# Patient Record
Sex: Male | Born: 1955 | Race: Black or African American | Hispanic: No | Marital: Single | Smoking: Current every day smoker
Health system: Southern US, Community
[De-identification: ages and names within clinical notes are randomized; demographics above are authoritative.]

## PROBLEM LIST (undated history)

## (undated) DIAGNOSIS — Z720 Tobacco use: Secondary | ICD-10-CM

## (undated) DIAGNOSIS — J449 Chronic obstructive pulmonary disease, unspecified: Secondary | ICD-10-CM

## (undated) DIAGNOSIS — I10 Essential (primary) hypertension: Secondary | ICD-10-CM

## (undated) DIAGNOSIS — R0989 Other specified symptoms and signs involving the circulatory and respiratory systems: Secondary | ICD-10-CM

## (undated) HISTORY — DX: Essential (primary) hypertension: I10

## (undated) HISTORY — DX: Chronic obstructive pulmonary disease, unspecified: J44.9

## (undated) HISTORY — PX: UPPER GI ENDOSCOPY: SHX6162

## (undated) HISTORY — DX: Tobacco use: Z72.0

## (undated) HISTORY — PX: OTHER SURGICAL HISTORY: SHX169

## (undated) HISTORY — DX: Other specified symptoms and signs involving the circulatory and respiratory systems: R09.89

---

## 2009-02-19 ENCOUNTER — Ambulatory Visit: Payer: Self-pay | Admitting: Internal Medicine

## 2012-02-02 ENCOUNTER — Ambulatory Visit: Payer: Self-pay | Admitting: Cardiology

## 2017-01-05 ENCOUNTER — Emergency Department
Admission: EM | Admit: 2017-01-05 | Discharge: 2017-01-05 | Disposition: A | Discharge status: Home / Self Care | Payer: Self-pay | Attending: Emergency Medicine | Admitting: Emergency Medicine

## 2017-01-05 ENCOUNTER — Encounter: Payer: Self-pay | Admitting: Emergency Medicine

## 2017-01-05 DIAGNOSIS — Y929 Unspecified place or not applicable: Secondary | ICD-10-CM | POA: Insufficient documentation

## 2017-01-05 DIAGNOSIS — Y999 Unspecified external cause status: Secondary | ICD-10-CM | POA: Insufficient documentation

## 2017-01-05 DIAGNOSIS — S39012A Strain of muscle, fascia and tendon of lower back, initial encounter: Secondary | ICD-10-CM | POA: Insufficient documentation

## 2017-01-05 DIAGNOSIS — X500XXA Overexertion from strenuous movement or load, initial encounter: Secondary | ICD-10-CM | POA: Insufficient documentation

## 2017-01-05 DIAGNOSIS — Y939 Activity, unspecified: Secondary | ICD-10-CM | POA: Insufficient documentation

## 2017-01-05 MED ORDER — CYCLOBENZAPRINE HCL 5 MG PO TABS: 5.0000 mg | ORAL_TABLET | Freq: Two times a day (BID) | ORAL | 0 refills | Status: AC | PRN

## 2017-01-05 MED ORDER — MELOXICAM 7.5 MG PO TABS
7.5000 mg | ORAL_TABLET | Freq: Every day | ORAL | 0 refills | Status: AC
Start: 2017-01-05 — End: 2017-01-12

## 2017-01-05 NOTE — ED Triage Notes (Signed)
Pt c/o low back pain. Pt ambulatory to triage.

## 2017-01-05 NOTE — ED Notes (Signed)
Pt verbalized understanding of discharge instructions. NAD at this time. 

## 2017-01-06 NOTE — ED Provider Notes (Signed)
[facility] Emergency Department Provider Note  ____________________________________________  Time seen: Approximately 11:11 AM  I have reviewed the triage vital signs and the nursing notes.   HISTORY  Chief Complaint Back Pain    HPI Austin Nunez is a 61 y.o. male presenting with low back pain. Patient states that low back pain has occured for the past 2 days. He has a history of lumbar strain. He noticed low back pain when he reached over to lift a heavy bag with papers. He denies radiculopathy. He states that Flexeril has helped lumbar strain in the past. He denies prior traumas or surgeries to the low back. He denies falls of any kind. He has been afebrile. No bowel or bladder incontinence. No new weakness. He has taken ibuprofen, which has not relieved his symptoms. 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 Low back pain is described as aching and 8/10 in intensity.    No past medical history on file.  There are no active problems to display for this patient.   History reviewed. No pertinent surgical history.  Prior to Admission medications   Medication Sig Start Date End Date Taking? Authorizing Provider  cyclobenzaprine (FLEXERIL) 5 MG tablet Take 1 tablet (5 mg total) by mouth 2 (two) times daily as needed for muscle spasms. 01/05/17 01/12/17  Orvil Feil, PA-C  meloxicam (MOBIC) 7.5 MG tablet Take 1 tablet (7.5 mg total) by mouth daily. 01/05/17 01/12/17  Orvil Feil, PA-C    Allergies Patient has no known allergies.  No family history on file.  Social History Social History  Substance Use Topics  . Smoking status: Never Smoker  . Smokeless tobacco: Never Used  . Alcohol use No     Review of Systems  Constitutional: No fever/chills ENT: No upper respiratory complaints. Cardiovascular: no chest pain. Respiratory: no cough. No SOB. Musculoskeletal: Patient has low back pain.  Skin: Negative for rash, abrasions, lacerations,  ecchymosis. Neurological: Negative for headaches, focal weakness or numbness.  ____________________________________________   PHYSICAL EXAM:  VITAL SIGNS: ED Triage Vitals  Enc Vitals Group     BP 01/05/17 1037 (!) 151/87     Pulse Rate 01/05/17 1037 85     Resp 01/05/17 1037 18     Temp 01/05/17 1037 97.9 F (36.6 C)     Temp Source 01/05/17 1037 Oral     SpO2 01/05/17 1037 96 %     Weight 01/05/17 1037 220 lb (99.8 kg)     Height 01/05/17 1037 5\' 10"  (1.778 m)     Head Circumference --      Peak Flow --      Pain Score 01/05/17 1034 8     Pain Loc --      Pain Edu? --      Excl. in GC? --     Constitutional: Alert and oriented. Well appearing and in no acute distress. Neck: FROM.  Cardiovascular: Normal rate, regular rhythm. Normal S1 and S2.  Good peripheral circulation. Respiratory: Normal respiratory effort without tachypnea or retractions. Lungs CTAB. Good air entry to the bases with no decreased or absent breath sounds. Musculoskeletal: Patient has 5/5 strength in the upper and lower extremities bilaterally. Full range of motion at the shoulder, elbow and wrist bilaterally. Full range of motion at the hip, knee and ankle bilaterally. No changes in gait.  Patient's low back pain is intensified with flexion at the spine. Negative straight leg raise test bilaterally. No tenderness to palpation is elicited  with palpation along the cervical, thoracic and lumbar regions of the spine. Neurologic:  Normal speech and language. No gross focal neurologic deficits are appreciated. Cranial nerves: 2-10 normal as tested. Cerebellar: Finger-nose-finger WNL, heel to shin WNL. Speech: No dysarthria or expressive aphasia.  Skin:  Skin is warm, dry and intact. No rash noted.  ____________________________________________   LABS (all labs ordered are listed, but only abnormal results are displayed)  Labs Reviewed - No data to  display ____________________________________________  EKG   ____________________________________________  RADIOLOGY   No results found.  ____________________________________________    PROCEDURES  Procedure(s) performed:    Procedures    Medications - No data to display   ____________________________________________   INITIAL IMPRESSION / ASSESSMENT AND PLAN / ED COURSE  Pertinent labs & imaging results that were available during my care of the patient were reviewed by me and considered in my medical decision making (see chart for details).  Review of the [place] CSRS was performed in accordance of the [facility] prior to dispensing any controlled drugs.     Assessment and Plan: Low back pain: Patient presents to the emergency department with low back pain. Patient denies fever, recreational drug use, bowel or bladder incontinence or weakness. Patient noticed low back pain after lifting a bag. Patient has no radiculopathy with a reassuring physical exam. Vital signs are also reassuring aside from hypertension. Lumbar strain is likely. Patient was prescribed Flexeril and Mobic to be used as needed for pain and inflammation. A referral was made to orthopedics, Dr. Hyacinth MeekerMiller. Patient was advised to make an appointment with orthopedics in one week if low back pain persists. All patient questions were answered. ____________________________________________  FINAL CLINICAL IMPRESSION(S) / ED DIAGNOSES  Final diagnoses:  Strain of lumbar region, initial encounter      NEW MEDICATIONS STARTED DURING THIS VISIT:  Discharge Medication List as of 01/05/2017 12:25 PM    START taking these medications   Details  cyclobenzaprine (FLEXERIL) 5 MG tablet Take 1 tablet (5 mg total) by mouth 2 (two) times daily as needed for muscle spasms., Starting Wed 01/05/2017, Until Wed 01/12/2017, Print    meloxicam (MOBIC) 7.5 MG tablet Take 1 tablet (7.5 mg total) by mouth daily., Starting Wed  01/05/2017, Until Wed 01/12/2017, Print            This chart was dictated using voice recognition software/Dragon. Despite best efforts to proofread, errors can occur which can change the meaning. Any change was purely unintentional.    Orvil FeilJaclyn M Rafel Garde, PA-C 01/06/17 1129    Emily FilbertJonathan E Williams, MD 01/06/17 [phone]

## 2018-06-30 DIAGNOSIS — I10 Essential (primary) hypertension: Secondary | ICD-10-CM | POA: Insufficient documentation

## 2018-06-30 DIAGNOSIS — Z720 Tobacco use: Secondary | ICD-10-CM | POA: Insufficient documentation

## 2018-06-30 DIAGNOSIS — R0989 Other specified symptoms and signs involving the circulatory and respiratory systems: Secondary | ICD-10-CM | POA: Insufficient documentation

## 2019-03-05 HISTORY — PX: OTHER SURGICAL HISTORY: SHX169

## 2020-01-07 DIAGNOSIS — J302 Other seasonal allergic rhinitis: Secondary | ICD-10-CM | POA: Insufficient documentation

## 2020-07-09 ENCOUNTER — Telehealth: Payer: Self-pay | Admitting: *Deleted

## 2020-07-09 DIAGNOSIS — Z122 Encounter for screening for malignant neoplasm of respiratory organs: Secondary | ICD-10-CM

## 2020-07-09 DIAGNOSIS — Z87891 Personal history of nicotine dependence: Secondary | ICD-10-CM

## 2020-07-09 NOTE — Telephone Encounter (Signed)
Received referral for initial lung cancer screening scan. Contacted patient and obtained smoking history,(current, 92 pack year) as well as answering questions related to screening process. Patient denies signs of lung cancer such as weight loss or hemoptysis. Patient denies comorbidity that would prevent curative treatment if lung cancer were found. Patient is scheduled for shared decision making visit and CT scan on date tbd due to patient preference.

## 2020-08-01 ENCOUNTER — Inpatient Hospital Stay: Payer: PRIVATE HEALTH INSURANCE | Attending: Oncology | Admitting: Hospice and Palliative Medicine

## 2020-08-01 DIAGNOSIS — Z87891 Personal history of nicotine dependence: Secondary | ICD-10-CM | POA: Diagnosis not present

## 2020-08-01 DIAGNOSIS — Z122 Encounter for screening for malignant neoplasm of respiratory organs: Secondary | ICD-10-CM | POA: Diagnosis not present

## 2020-08-01 NOTE — Progress Notes (Signed)
Virtual Visit via Video Note  I connected with@ on 08/01/20 at@ by a video enabled telemedicine application and verified that I am speaking with the correct person using two identifiers.   I discussed the limitations of evaluation and management by telemedicine and the availability of in person appointments. The patient expressed understanding and agreed to proceed.  In accordance with CMS guidelines, patient has met eligibility criteria including age, absence of signs or symptoms of lung cancer.  Social History   Tobacco Use  . Smoking status: Never Smoker  . Smokeless tobacco: Never Used  Substance Use Topics  . Alcohol use: No  . Drug use: Not on file      A shared decision-making session was conducted prior to the performance of CT scan. This includes one or more decision aids, includes benefits and harms of screening, follow-up diagnostic testing, over-diagnosis, false positive rate, and total radiation exposure.   Counseling on the importance of adherence to annual lung cancer LDCT screening, impact of co-morbidities, and ability or willingness to undergo diagnosis and treatment is imperative for compliance of the program.   Counseling on the importance of continued smoking cessation for former smokers; the importance of smoking cessation for current smokers, and information about tobacco cessation interventions have been given to patient including [place] and 1800 quit [place] programs.   Written order for lung cancer screening with LDCT has been given to the patient and any and all questions have been answered to the best of my abilities.    Yearly follow up will be coordinated by Burgess Estelle, Thoracic Navigator.  Time Total: 15 minutes  Visit consisted of counseling and education dealing with complex health screening. Greater than 50%  of this time was spent counseling and coordinating care related to the above assessment and plan.  Signed by: Altha Harm,  PhD, NP-C

## 2020-08-04 ENCOUNTER — Encounter: Payer: Self-pay | Admitting: *Deleted

## 2020-08-04 ENCOUNTER — Ambulatory Visit: Admission: RE | Admit: 2020-08-04 | Payer: PRIVATE HEALTH INSURANCE | Source: Ambulatory Visit

## 2020-08-05 ENCOUNTER — Ambulatory Visit
Admission: RE | Admit: 2020-08-05 | Discharge: 2020-08-05 | Disposition: A | Discharge status: Home / Self Care | Payer: Self-pay | Source: Ambulatory Visit | Attending: Oncology | Admitting: Oncology

## 2020-08-05 ENCOUNTER — Other Ambulatory Visit: Payer: Self-pay

## 2020-08-05 DIAGNOSIS — Z122 Encounter for screening for malignant neoplasm of respiratory organs: Secondary | ICD-10-CM | POA: Insufficient documentation

## 2020-08-05 DIAGNOSIS — Z87891 Personal history of nicotine dependence: Secondary | ICD-10-CM | POA: Insufficient documentation

## 2020-08-11 ENCOUNTER — Encounter: Payer: Self-pay | Admitting: *Deleted

## 2020-12-21 IMAGING — CT CT CHEST LUNG CANCER SCREENING LOW DOSE W/O CM
2 of 5 series · 15 of 40 positions shown, 18 images · non-contrast
Comparison: None.

CLINICAL DATA: 64-year-old male current smoker, with 92 pack-year
history of smoking, for initial lung cancer screening

EXAM:
CT CHEST WITHOUT CONTRAST LOW-DOSE FOR LUNG CANCER SCREENING
TECHNIQUE: Multidetector CT imaging of the chest was performed following the
standard protocol without IV contrast.

[Series 3: lung 1.00 · axial · 0.81mm/px · z∈[-1240,-910]mm · 12 of 366 slices shown, 15 images]
[im 18/366  mediastinal]
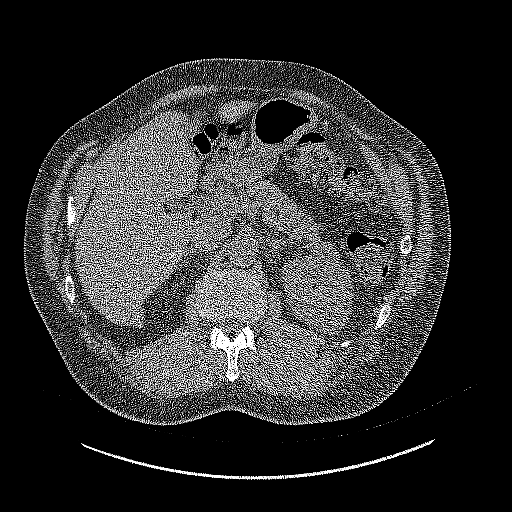
[im 18/366  lung]
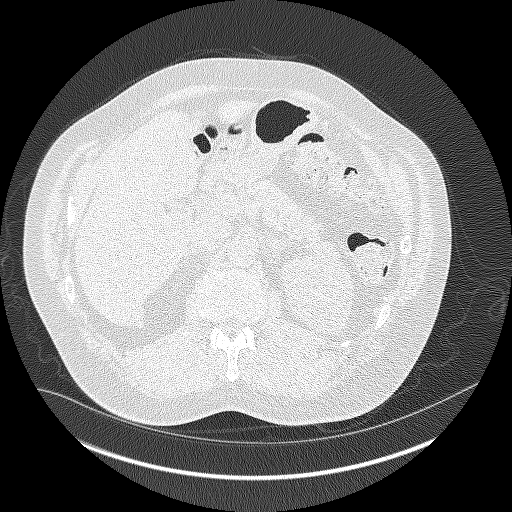
[im 53/366  lung]
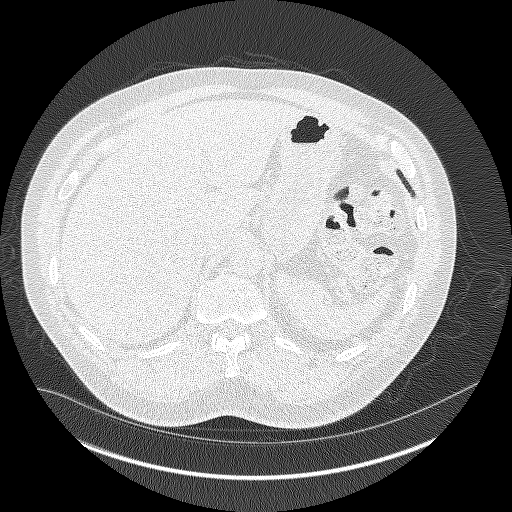
[im 87/366  lung]
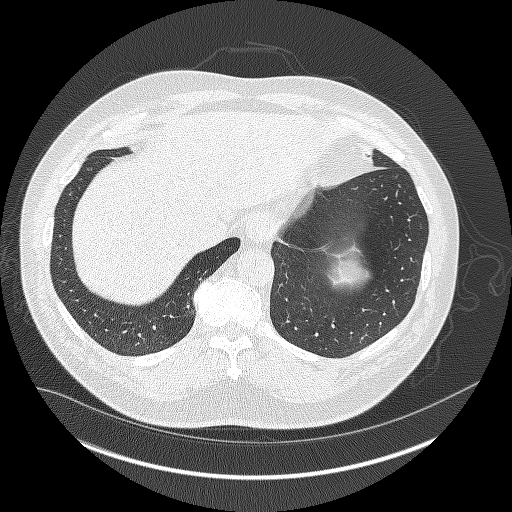
[im 105/366  lung]
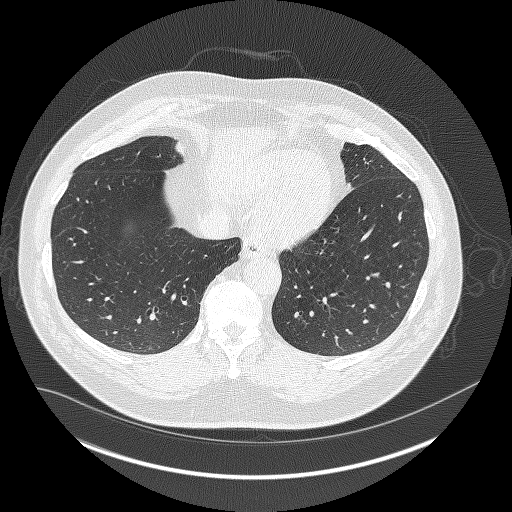
[im 140/366  mediastinal]
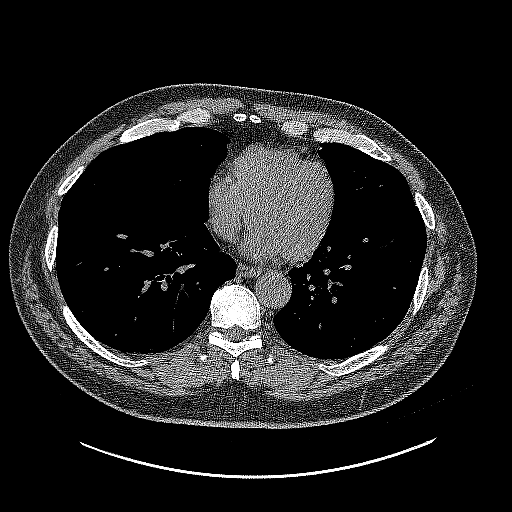
[im 140/366  lung]
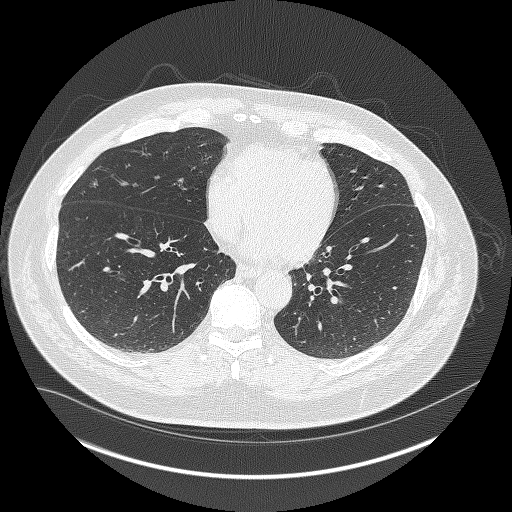
[im 174/366  lung]
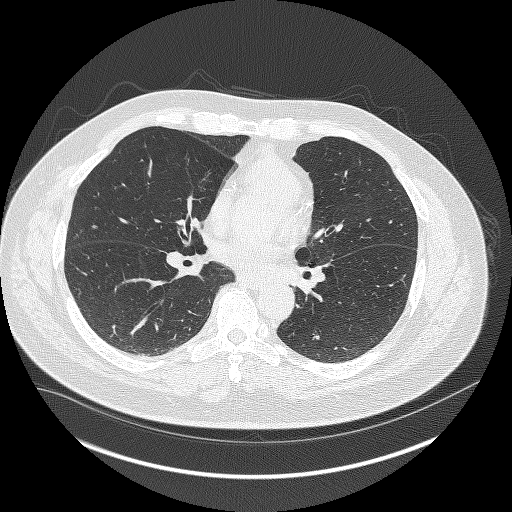
[im 192/366  lung]
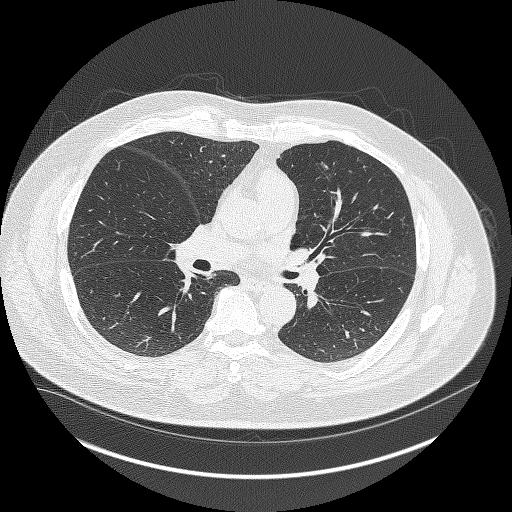
[im 226/366  lung]
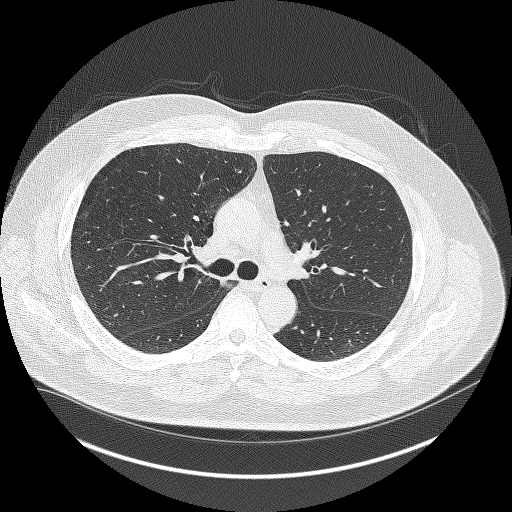
[im 261/366  mediastinal]
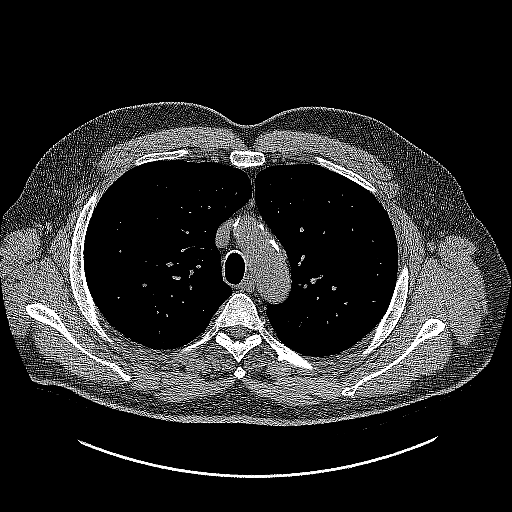
[im 261/366  lung]
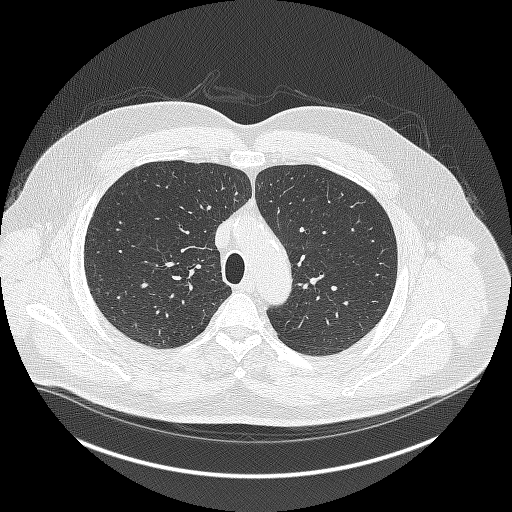
[im 279/366  lung]
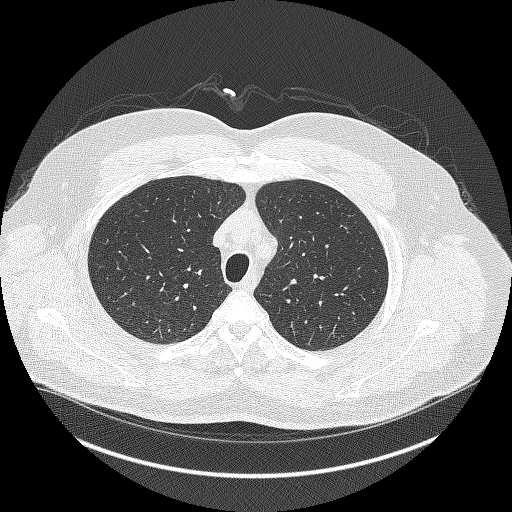
[im 313/366  lung]
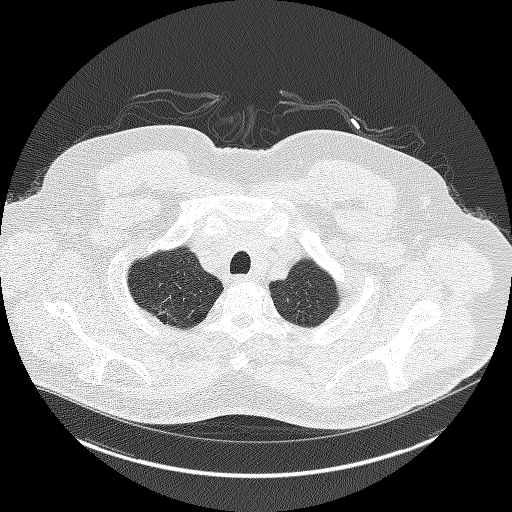
[im 348/366  lung]
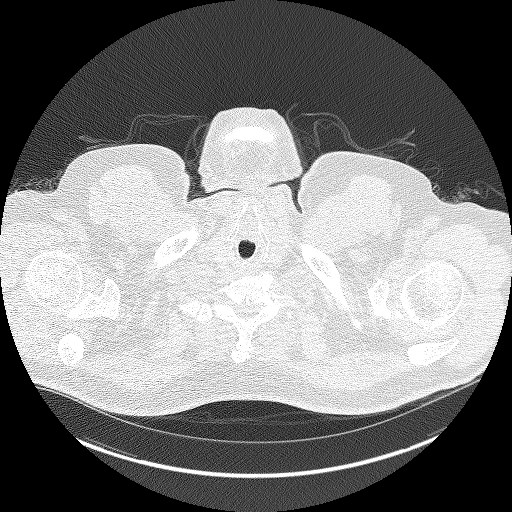

[Series 4: coronals lung 1.00 cor · coronal · 0.72mm/px · 3 of 359 slices shown]
[im 72/359  lung]
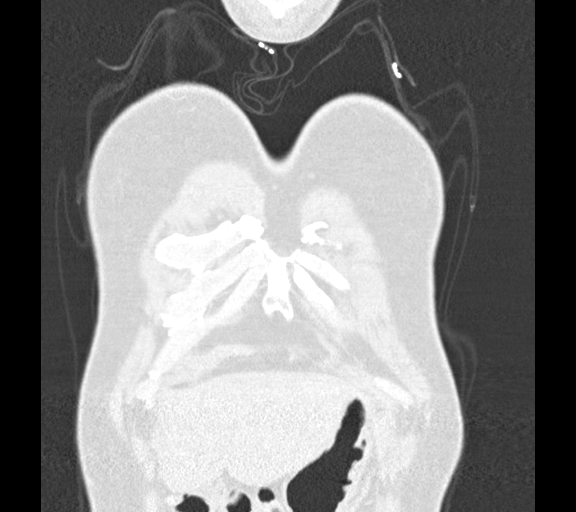
[im 144/359  lung]
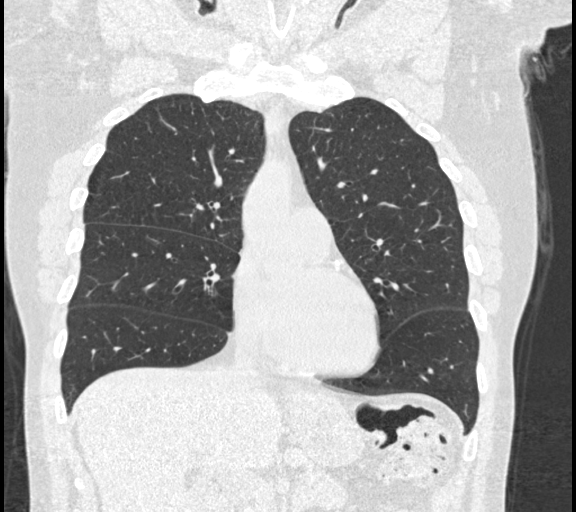
[im 215/359  lung]
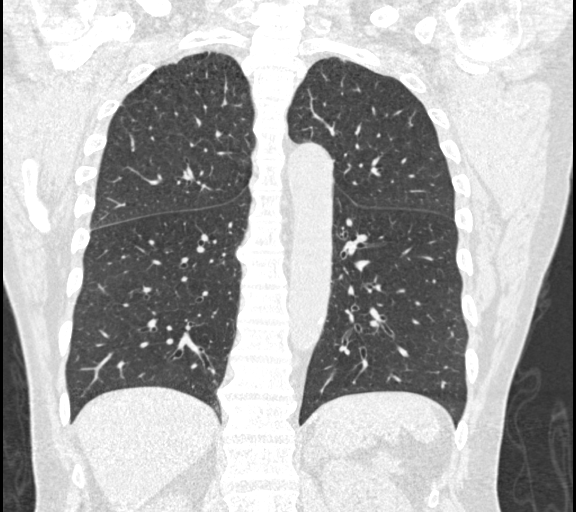

[15 of 40 positions shown; findings below may reference images not displayed]

FINDINGS: Cardiovascular: Heart is normal in size.  No pericardial effusion.

No evidence thoracic aortic aneurysm. Atherosclerotic calcifications
of the aortic arch.

Mild three-vessel coronary atherosclerosis.

Mediastinum/Nodes: No suspicious mediastinal lymphadenopathy.

Visualized thyroid unremarkable.

Lungs/Pleura: Mild biapical pleural-parenchymal scarring.

Mild centrilobular and paraseptal emphysematous changes, upper lung
predominant.

5.7 mm subpleural nodule in the posterior right lower lobe.

No focal consolidation.

No pleural effusion or pneumothorax.

Upper Abdomen: Visualized upper abdomen is notable for a 2.6 cm left
adrenal adenoma and mild vascular calcifications.

Musculoskeletal: Mild degenerative changes of the visualized
thoracolumbar spine.
IMPRESSION: Lung-RADS 2, benign appearance or behavior. Continue annual
screening with low-dose chest CT without contrast in 12 months.

Aortic Atherosclerosis (UB8F3-7Z6.6) and Emphysema (UB8F3-B3P.1).

## 2021-02-04 ENCOUNTER — Telehealth: Payer: Self-pay | Admitting: Oncology

## 2021-02-04 NOTE — Telephone Encounter (Signed)
Patient called and stated that he needs to cancel his appt on 2/21. He declined to r/s at this time due to "work conflicts" and stated he would call back.

## 2021-02-09 ENCOUNTER — Inpatient Hospital Stay: Payer: PRIVATE HEALTH INSURANCE

## 2021-02-09 ENCOUNTER — Inpatient Hospital Stay: Payer: PRIVATE HEALTH INSURANCE | Admitting: Oncology

## 2021-08-17 DIAGNOSIS — I700 Atherosclerosis of aorta: Secondary | ICD-10-CM | POA: Insufficient documentation

## 2021-09-01 ENCOUNTER — Encounter: Payer: Self-pay | Admitting: Oncology

## 2021-09-01 ENCOUNTER — Inpatient Hospital Stay: Payer: Medicare Other | Attending: Oncology | Admitting: Oncology

## 2021-09-01 ENCOUNTER — Inpatient Hospital Stay: Payer: Medicare Other

## 2021-09-01 VITALS — BP 147/86 | HR 84 | Temp 98.2°F | Resp 18 | Wt 217.7 lb

## 2021-09-01 DIAGNOSIS — Z79899 Other long term (current) drug therapy: Secondary | ICD-10-CM | POA: Diagnosis not present

## 2021-09-01 DIAGNOSIS — I10 Essential (primary) hypertension: Secondary | ICD-10-CM | POA: Insufficient documentation

## 2021-09-01 DIAGNOSIS — F17210 Nicotine dependence, cigarettes, uncomplicated: Secondary | ICD-10-CM | POA: Insufficient documentation

## 2021-09-01 DIAGNOSIS — D72829 Elevated white blood cell count, unspecified: Secondary | ICD-10-CM

## 2021-09-01 DIAGNOSIS — Z720 Tobacco use: Secondary | ICD-10-CM

## 2021-09-01 DIAGNOSIS — J449 Chronic obstructive pulmonary disease, unspecified: Secondary | ICD-10-CM | POA: Insufficient documentation

## 2021-09-01 LAB — CBC WITH DIFFERENTIAL/PLATELET
Abs Immature Granulocytes: 0.06 10*3/uL (ref 0.00–0.07)
Basophils Absolute: 0.1 10*3/uL (ref 0.0–0.1)
Basophils Relative: 1 %
Eosinophils Absolute: 0.3 10*3/uL (ref 0.0–0.5)
Eosinophils Relative: 2 %
HCT: 48.8 % (ref 39.0–52.0)
Hemoglobin: 16.2 g/dL (ref 13.0–17.0)
Immature Granulocytes: 1 %
Lymphocytes Relative: 18 %
Lymphs Abs: 2.3 10*3/uL (ref 0.7–4.0)
MCH: 29 pg (ref 26.0–34.0)
MCHC: 33.2 g/dL (ref 30.0–36.0)
MCV: 87.3 fL (ref 80.0–100.0)
Monocytes Absolute: 0.9 10*3/uL (ref 0.1–1.0)
Monocytes Relative: 7 %
Neutro Abs: 9.1 10*3/uL — ABNORMAL HIGH (ref 1.7–7.7)
Neutrophils Relative %: 71 %
Platelets: 267 10*3/uL (ref 150–400)
RBC: 5.59 MIL/uL (ref 4.22–5.81)
RDW: 15.5 % (ref 11.5–15.5)
WBC: 12.7 10*3/uL — ABNORMAL HIGH (ref 4.0–10.5)
nRBC: 0 % (ref 0.0–0.2)

## 2021-09-01 LAB — TECHNOLOGIST SMEAR REVIEW
Plt Morphology: NORMAL
RBC MORPHOLOGY: NORMAL
WBC MORPHOLOGY: NORMAL

## 2021-09-01 LAB — COMPREHENSIVE METABOLIC PANEL
ALT: 18 U/L (ref 0–44)
AST: 21 U/L (ref 15–41)
Albumin: 3.9 g/dL (ref 3.5–5.0)
Alkaline Phosphatase: 80 U/L (ref 38–126)
Anion gap: 7 (ref 5–15)
BUN: 10 mg/dL (ref 8–23)
CO2: 24 mmol/L (ref 22–32)
Calcium: 8.7 mg/dL — ABNORMAL LOW (ref 8.9–10.3)
Chloride: 105 mmol/L (ref 98–111)
Creatinine, Ser: 0.9 mg/dL (ref 0.61–1.24)
GFR, Estimated: >60 mL/min (ref >60)
Glucose, Bld: 92 mg/dL (ref 70–99)
Potassium: 3.9 mmol/L (ref 3.5–5.1)
Sodium: 136 mmol/L (ref 135–145)
Total Bilirubin: 0.7 mg/dL (ref 0.3–1.2)
Total Protein: 7.8 g/dL (ref 6.5–8.1)

## 2021-09-01 LAB — LACTATE DEHYDROGENASE: LDH: 154 U/L (ref 98–192)

## 2021-09-01 NOTE — Progress Notes (Signed)
Hematology/Oncology Consult note [facility] Telephone:([phone] [phone]   Patient Care Team: Tracie Harrier, MD as PCP - General (Internal Medicine)  REFERRING PROVIDER: Tracie Harrier, MD  CHIEF COMPLAINTS/REASON FOR VISIT:  Evaluation of leukocytosis  HISTORY OF PRESENTING ILLNESS:  Austin Nunez is a  65 y.o.  male with PMH listed below who was referred to me for evaluation of leukocytosis Reviewed patient' recent labs obtained by PCP.   08/12/2021 CBC showed elevated white count of 14.3, predominantly neutrophilia and basophilia.  Previous lab records reviewed. Leukocytosis onset of chronic, duration is since at least March 2019.   No aggravating or elevated factors. Associated symptoms or signs: denies Denies weight loss, fever, chills,night sweats, fatigue Smoking history: every day smoker, 2 packs per day, 92 pack year smoking history History of recent oral steroid use or steroid injection: denies History of recent infection: denies Autoimmune disease history: denies    Review of Systems  Constitutional:  Negative for appetite change, chills, fatigue, fever and unexpected weight change.  HENT:   Negative for hearing loss and voice change.   Eyes:  Negative for eye problems and icterus.  Respiratory:  Negative for chest tightness, cough and shortness of breath.   Cardiovascular:  Negative for chest pain and leg swelling.  Gastrointestinal:  Negative for abdominal distention and abdominal pain.  Endocrine: Negative for hot flashes.  Genitourinary:  Negative for difficulty urinating, dysuria and frequency.   Musculoskeletal:  Negative for arthralgias.  Skin:  Negative for itching and rash.  Neurological:  Negative for light-headedness and numbness.  Hematological:  Negative for adenopathy. Does not bruise/bleed easily.  Psychiatric/Behavioral:  Negative for confusion.     MEDICAL HISTORY:  Past Medical History:  Diagnosis  Date   COPD (chronic obstructive pulmonary disease) ([place])    Essential hypertension    Pulmonary hyperinflation    Tobacco user     SURGICAL HISTORY: Past Surgical History:  Procedure Laterality Date   colonoscopy  03/05/2019   hyperplastic colon polyp/ repeat 10 yrs   repair malunion/ non union femur right 1989     UPPER GI ENDOSCOPY      SOCIAL HISTORY: Social History   Socioeconomic History   Marital status: Single    Spouse name: Not on file   Number of children: Not on file   Years of education: Not on file   Highest education level: Not on file  Occupational History   Not on file  Tobacco Use   Smoking status: Every Day    Packs/day: 2.00    Years: 46.00    Pack years: 92.00    Types: Cigarettes   Smokeless tobacco: Never  Substance and Sexual Activity   Alcohol use: No   Drug use: Not on file   Sexual activity: Not on file  Other Topics Concern   Not on file  Social History Narrative   Not on file   Social Determinants of Health   Financial Resource Strain: Not on file  Food Insecurity: Not on file  Transportation Needs: Not on file  Physical Activity: Not on file  Stress: Not on file  Social Connections: Not on file  Intimate Partner Violence: Not on file    FAMILY HISTORY: History reviewed. No pertinent family history.  ALLERGIES:  is allergic to aspirin.  MEDICATIONS:  Current Outpatient Medications  Medication Sig Dispense Refill   amLODipine (NORVASC) 10 MG tablet Take 10 mg by mouth daily.     budesonide-formoterol (SYMBICORT) 160-4.5 MCG/ACT  inhaler Inhale into the lungs.     olmesartan (BENICAR) 40 MG tablet Take 40 mg by mouth daily.     No current facility-administered medications for this visit.     PHYSICAL EXAMINATION: ECOG PERFORMANCE STATUS: 0 - Asymptomatic Vitals:   09/01/21 0943  BP: (!) 147/86  Pulse: 84  Resp: 18  Temp: 98.2 F (36.8 C)  SpO2: 96%   Filed Weights   09/01/21 0943  Weight: 217 lb 11.2 oz (98.7  kg)    Physical Exam Constitutional:      General: He is not in acute distress. HENT:     Head: Normocephalic and atraumatic.  Eyes:     General: No scleral icterus. Cardiovascular:     Rate and Rhythm: Normal rate and regular rhythm.     Heart sounds: Normal heart sounds.  Pulmonary:     Effort: Pulmonary effort is normal. No respiratory distress.     Breath sounds: No wheezing.     Comments: Severely decreased breath sound bilaterally.  Abdominal:     General: Bowel sounds are normal. There is no distension.     Palpations: Abdomen is soft.  Musculoskeletal:        General: No deformity. Normal range of motion.     Cervical back: Normal range of motion and neck supple.  Skin:    General: Skin is warm and dry.     Findings: No erythema or rash.  Neurological:     Mental Status: He is alert and oriented to person, place, and time. Mental status is at baseline.     Cranial Nerves: No cranial nerve deficit.     Coordination: Coordination normal.  Psychiatric:        Mood and Affect: Mood normal.    CMP Latest Ref Rng & Units 09/01/2021  Glucose 70 - 99 mg/dL 92  BUN 8 - 23 mg/dL 10  Creatinine 0.61 - 1.24 mg/dL 0.90  Sodium 135 - 145 mmol/L 136  Potassium 3.5 - 5.1 mmol/L 3.9  Chloride 98 - 111 mmol/L 105  CO2 22 - 32 mmol/L 24  Calcium 8.9 - 10.3 mg/dL 8.7(L)  Total Protein 6.5 - 8.1 g/dL 7.8  Total Bilirubin 0.3 - 1.2 mg/dL 0.7  Alkaline Phos 38 - 126 U/L 80  AST 15 - 41 U/L 21  ALT 0 - 44 U/L 18   CBC Latest Ref Rng & Units 09/01/2021  WBC 4.0 - 10.5 K/uL 12.7(H)  Hemoglobin 13.0 - 17.0 g/dL 16.2  Hematocrit 39.0 - 52.0 % 48.8  Platelets 150 - 400 K/uL 267     RADIOGRAPHIC STUDIES: I have personally reviewed the radiological images as listed and agreed with the findings in the report. No results found.  LABORATORY DATA:  I have reviewed the data as listed Lab Results  Component Value Date   WBC 12.7 (H) 09/01/2021   HGB 16.2 09/01/2021   HCT 48.8  09/01/2021   MCV 87.3 09/01/2021   PLT 267 09/01/2021   Recent Labs    09/01/21 1019  NA 136  K 3.9  CL 105  CO2 24  GLUCOSE 92  BUN 10  CREATININE 0.90  CALCIUM 8.7*  GFRNONAA >60  PROT 7.8  ALBUMIN 3.9  AST 21  ALT 18  ALKPHOS 80  BILITOT 0.7   Iron/TIBC/Ferritin/ %Sat No results found for: IRON, TIBC, FERRITIN, IRONPCTSAT      ASSESSMENT & PLAN:  1. Leukocytosis, unspecified type   2. Tobacco user    Labs  reviewed and discussed with patient that Leukocytosis, predominantly neutrophilia/predominantly,  can be secondary to infection, chronic inflammation, smoking, autoimmune disease, or underlying bone marrow disorders, smoking.   For the work up of patient's leukocytosis, I recommend checking CBC;CMP, LDH, pathology smear review, flowcytometry. SPEP, Jak2 V617F mutation with reflex, etc.  Tobacco abuse, smoke cessation was discussed with patient.  Recommend annual lung cancer screening. Refer to lung cancer screening program.     Orders Placed This Encounter  Procedures   Technologist smear review    Standing Status:   Future    Number of Occurrences:   1    Standing Expiration Date:   09/01/2022   CBC with Differential/Platelet    Standing Status:   Future    Number of Occurrences:   1    Standing Expiration Date:   09/01/2022   Comprehensive metabolic panel    Standing Status:   Future    Number of Occurrences:   1    Standing Expiration Date:   09/01/2022   Multiple Myeloma Panel (SPEP&IFE w/QIG)    Standing Status:   Future    Number of Occurrences:   1    Standing Expiration Date:   09/01/2022   Kappa/lambda light chains    Standing Status:   Future    Number of Occurrences:   1    Standing Expiration Date:   09/01/2022   Flow cytometry panel-leukemia/lymphoma work-up    Standing Status:   Future    Number of Occurrences:   1    Standing Expiration Date:   09/01/2022   Lactate dehydrogenase    Standing Status:   Future    Number of Occurrences:    1    Standing Expiration Date:   09/01/2022   JAK2 V617F, w Reflex to CALR/E12/MPL    Standing Status:   Future    Number of Occurrences:   1    Standing Expiration Date:   09/01/2022   BCR-ABL1 FISH    Standing Status:   Future    Number of Occurrences:   1    Standing Expiration Date:   09/01/2022   Ambulatory Referral for Lung Cancer Scre    Referral Priority:   Routine    Referral Type:   Consultation    Referral Reason:   Specialty Services Required    Number of Visits Requested:   1    All questions were answered. The patient knows to call the clinic with any problems questions or concerns.  Return of visit: 2 weeks to discuss labs. Thank you for this kind referral and the opportunity to participate in the care of this patient. A copy of today's note is routed to referring provider   Earlie Server, MD, PhD Hematology Oncology [facility] Pager- [phone] 09/01/2021

## 2021-09-02 LAB — COMP PANEL: LEUKEMIA/LYMPHOMA

## 2021-09-02 LAB — KAPPA/LAMBDA LIGHT CHAINS
Kappa free light chain: 32 mg/L — ABNORMAL HIGH (ref 3.3–19.4)
Kappa, lambda light chain ratio: 1.88 — ABNORMAL HIGH (ref 0.26–1.65)
Lambda free light chains: 17 mg/L (ref 5.7–26.3)

## 2021-09-04 LAB — BCR-ABL1 FISH
Cells Analyzed: 200
Cells Counted: 200

## 2021-09-05 LAB — MULTIPLE MYELOMA PANEL, SERUM
Albumin SerPl Elph-Mcnc: 3.5 g/dL (ref 2.9–4.4)
Albumin/Glob SerPl: 1.1 (ref 0.7–1.7)
Alpha 1: 0.2 g/dL (ref 0.0–0.4)
Alpha2 Glob SerPl Elph-Mcnc: 0.7 g/dL (ref 0.4–1.0)
B-Globulin SerPl Elph-Mcnc: 0.9 g/dL (ref 0.7–1.3)
Gamma Glob SerPl Elph-Mcnc: 1.3 g/dL (ref 0.4–1.8)
Globulin, Total: 3.2 g/dL (ref 2.2–3.9)
IgA: 209 mg/dL (ref 61–437)
IgG (Immunoglobin G), Serum: 1588 mg/dL (ref 603–1613)
IgM (Immunoglobulin M), Srm: 80 mg/dL (ref 20–172)
Total Protein ELP: 6.7 g/dL (ref 6.0–8.5)

## 2021-09-09 LAB — JAK2 V617F, W REFLEX TO CALR/E12/MPL

## 2021-09-09 LAB — CALR + JAK2 E12-15 + MPL (REFLEXED)

## 2021-09-18 ENCOUNTER — Inpatient Hospital Stay: Payer: Medicare Other | Admitting: Oncology

## 2021-10-05 ENCOUNTER — Inpatient Hospital Stay: Payer: Medicare Other

## 2021-10-05 ENCOUNTER — Other Ambulatory Visit: Payer: Self-pay

## 2021-10-05 ENCOUNTER — Inpatient Hospital Stay: Payer: Medicare Other | Attending: Oncology | Admitting: Oncology

## 2021-10-05 ENCOUNTER — Encounter: Payer: Self-pay | Admitting: Oncology

## 2021-10-05 VITALS — BP 129/82 | HR 91 | Temp 97.0°F | Resp 18 | Wt 217.8 lb

## 2021-10-05 DIAGNOSIS — F17210 Nicotine dependence, cigarettes, uncomplicated: Secondary | ICD-10-CM | POA: Diagnosis not present

## 2021-10-05 DIAGNOSIS — I10 Essential (primary) hypertension: Secondary | ICD-10-CM | POA: Insufficient documentation

## 2021-10-05 DIAGNOSIS — Z720 Tobacco use: Secondary | ICD-10-CM | POA: Diagnosis not present

## 2021-10-05 DIAGNOSIS — J449 Chronic obstructive pulmonary disease, unspecified: Secondary | ICD-10-CM | POA: Diagnosis not present

## 2021-10-05 DIAGNOSIS — D72829 Elevated white blood cell count, unspecified: Secondary | ICD-10-CM

## 2021-10-05 NOTE — Progress Notes (Signed)
Hematology/Oncology Consult note [facility] Telephone:([phone] [phone]   Patient Care Team: Tracie Harrier, MD as PCP - General (Internal Medicine)  REFERRING PROVIDER: Tracie Harrier, MD  CHIEF COMPLAINTS/REASON FOR VISIT:  Evaluation of leukocytosis  HISTORY OF PRESENTING ILLNESS:  Austin Nunez is a  65 y.o.  male with PMH listed below who was referred to me for evaluation of leukocytosis Reviewed patient' recent labs obtained by PCP.   08/12/2021 CBC showed elevated white count of 14.3, predominantly neutrophilia and basophilia.  Previous lab records reviewed. Leukocytosis onset of chronic, duration is since at least March 2019.   No aggravating or elevated factors. Associated symptoms or signs: denies Denies weight loss, fever, chills,night sweats, fatigue Smoking history: every day smoker, 2 packs per day, 92 pack year smoking history History of recent oral steroid use or steroid injection: denies History of recent infection: denies Autoimmune disease history: denies  INTERVAL HISTORY Emeril Stille is a 65 y.o. male who has above history reviewed by me today presents for follow up visit for leukocytosis Problems and complaints are listed below: Patient had a blood work done and presents to discuss results.  No new complaints.   Review of Systems  Constitutional:  Negative for appetite change, chills, fatigue, fever and unexpected weight change.  HENT:   Negative for hearing loss, trouble swallowing and voice change.   Eyes:  Negative for eye problems and icterus.  Respiratory:  Negative for chest tightness, cough and shortness of breath.   Cardiovascular:  Negative for chest pain and leg swelling.  Gastrointestinal:  Negative for abdominal distention and abdominal pain.  Endocrine: Negative for hot flashes.  Genitourinary:  Negative for difficulty urinating, dysuria and frequency.   Musculoskeletal:  Negative for arthralgias.   Skin:  Negative for itching and rash.  Neurological:  Negative for light-headedness and numbness.  Hematological:  Negative for adenopathy. Does not bruise/bleed easily.  Psychiatric/Behavioral:  Negative for confusion.     MEDICAL HISTORY:  Past Medical History:  Diagnosis Date   COPD (chronic obstructive pulmonary disease) ([place])    Essential hypertension    Pulmonary hyperinflation    Tobacco user     SURGICAL HISTORY: Past Surgical History:  Procedure Laterality Date   colonoscopy  03/05/2019   hyperplastic colon polyp/ repeat 10 yrs   repair malunion/ non union femur right 1989     UPPER GI ENDOSCOPY      SOCIAL HISTORY: Social History   Socioeconomic History   Marital status: Single    Spouse name: Not on file   Number of children: Not on file   Years of education: Not on file   Highest education level: Not on file  Occupational History   Not on file  Tobacco Use   Smoking status: Every Day    Packs/day: 2.00    Years: 46.00    Pack years: 92.00    Types: Cigarettes   Smokeless tobacco: Never  Substance and Sexual Activity   Alcohol use: No   Drug use: Not on file   Sexual activity: Not on file  Other Topics Concern   Not on file  Social History Narrative   Not on file   Social Determinants of Health   Financial Resource Strain: Not on file  Food Insecurity: Not on file  Transportation Needs: Not on file  Physical Activity: Not on file  Stress: Not on file  Social Connections: Not on file  Intimate Partner Violence: Not on file  FAMILY HISTORY: History reviewed. No pertinent family history.  ALLERGIES:  is allergic to aspirin.  MEDICATIONS:  Current Outpatient Medications  Medication Sig Dispense Refill   amLODipine (NORVASC) 10 MG tablet Take 10 mg by mouth daily.     budesonide-formoterol (SYMBICORT) 160-4.5 MCG/ACT inhaler Inhale into the lungs.     olmesartan (BENICAR) 40 MG tablet Take 40 mg by mouth daily.     No current  facility-administered medications for this visit.     PHYSICAL EXAMINATION: ECOG PERFORMANCE STATUS: 0 - Asymptomatic Vitals:   10/05/21 0842  BP: 129/82  Pulse: 91  Resp: 18  Temp: (!) 97 F (36.1 C)  SpO2: 96%   Filed Weights   10/05/21 0842  Weight: 217 lb 12.8 oz (98.8 kg)    Physical Exam Constitutional:      General: He is not in acute distress. HENT:     Head: Normocephalic and atraumatic.  Eyes:     General: No scleral icterus. Cardiovascular:     Rate and Rhythm: Normal rate and regular rhythm.     Heart sounds: Normal heart sounds.  Pulmonary:     Effort: Pulmonary effort is normal. No respiratory distress.     Breath sounds: No wheezing.     Comments: Severely decreased breath sound bilaterally.  Abdominal:     General: Bowel sounds are normal. There is no distension.     Palpations: Abdomen is soft.  Musculoskeletal:        General: No deformity. Normal range of motion.     Cervical back: Normal range of motion and neck supple.  Skin:    General: Skin is warm and dry.     Findings: No erythema or rash.  Neurological:     Mental Status: He is alert and oriented to person, place, and time. Mental status is at baseline.     Cranial Nerves: No cranial nerve deficit.     Coordination: Coordination normal.  Psychiatric:        Mood and Affect: Mood normal.    CMP Latest Ref Rng & Units 09/01/2021  Glucose 70 - 99 mg/dL 92  BUN 8 - 23 mg/dL 10  Creatinine 0.61 - 1.24 mg/dL 0.90  Sodium 135 - 145 mmol/L 136  Potassium 3.5 - 5.1 mmol/L 3.9  Chloride 98 - 111 mmol/L 105  CO2 22 - 32 mmol/L 24  Calcium 8.9 - 10.3 mg/dL 8.7(L)  Total Protein 6.5 - 8.1 g/dL 7.8  Total Bilirubin 0.3 - 1.2 mg/dL 0.7  Alkaline Phos 38 - 126 U/L 80  AST 15 - 41 U/L 21  ALT 0 - 44 U/L 18   CBC Latest Ref Rng & Units 09/01/2021  WBC 4.0 - 10.5 K/uL 12.7(H)  Hemoglobin 13.0 - 17.0 g/dL 16.2  Hematocrit 39.0 - 52.0 % 48.8  Platelets 150 - 400 K/uL 267     RADIOGRAPHIC  STUDIES: I have personally reviewed the radiological images as listed and agreed with the findings in the report. No results found.  LABORATORY DATA:  I have reviewed the data as listed Lab Results  Component Value Date   WBC 12.7 (H) 09/01/2021   HGB 16.2 09/01/2021   HCT 48.8 09/01/2021   MCV 87.3 09/01/2021   PLT 267 09/01/2021   Recent Labs    09/01/21 1019  NA 136  K 3.9  CL 105  CO2 24  GLUCOSE 92  BUN 10  CREATININE 0.90  CALCIUM 8.7*  GFRNONAA >60  PROT 7.8  ALBUMIN  3.9  AST 21  ALT 18  ALKPHOS 80  BILITOT 0.7    Iron/TIBC/Ferritin/ %Sat No results found for: IRON, TIBC, FERRITIN, IRONPCTSAT      ASSESSMENT & PLAN:  1. Leukocytosis, unspecified type   2. Tobacco user   3. Hypocalcemia    #Leukocytosis  Labs reviewed and discussed with patient. Multiple myeloma panel is negative.  LDH normal.JAK2 V617F mutation negative, with reflex to other mutations CALR, MPL, JAK 2 Ex 12-15 mutations negative. Flow cytometry negative. -Check T-cell receptor rearrangement. BCR ABL negative. I think leukocytosis is reactive in nature, likely due to tobacco use.   Tobacco use, smoke cessation was discussed. Refer patient to lung cancer CT screening program.  Mild hypocalcemia, recommend patient to take OTC calcium supplementation.   Follow-up in 6 months. Orders Placed This Encounter  Procedures   Miscellaneous [facility] test (send-out)    Standing Status:   Future    Number of Occurrences:   1    Standing Expiration Date:   10/05/2022    Order Specific Question:   Test name / description:    Answer:   T cell receptor gene rearrangement [facility] [id number]   Ambulatory Referral for Lung Cancer Screening    Referral Priority:   Routine    Referral Type:   Consultation    Referral Reason:   Specialty Services Required    Number of Visits Requested:   1    All questions were answered. The patient knows to call the clinic with any problems questions or  concerns.  Earlie Server, MD, PhD  10/05/2021

## 2021-10-09 LAB — MISC LABCORP TEST (SEND OUT): Labcorp test code: 481080

## 2021-10-15 ENCOUNTER — Other Ambulatory Visit: Payer: Self-pay | Admitting: *Deleted

## 2021-10-15 DIAGNOSIS — Z87891 Personal history of nicotine dependence: Secondary | ICD-10-CM

## 2021-10-15 DIAGNOSIS — F17210 Nicotine dependence, cigarettes, uncomplicated: Secondary | ICD-10-CM

## 2021-11-09 ENCOUNTER — Ambulatory Visit
Admission: RE | Admit: 2021-11-09 | Discharge: 2021-11-09 | Disposition: A | Discharge status: Home / Self Care | Payer: 59 | Source: Ambulatory Visit | Attending: Acute Care | Admitting: Acute Care

## 2021-11-09 ENCOUNTER — Other Ambulatory Visit: Payer: Self-pay

## 2021-11-09 DIAGNOSIS — F17210 Nicotine dependence, cigarettes, uncomplicated: Secondary | ICD-10-CM | POA: Diagnosis not present

## 2021-11-09 DIAGNOSIS — Z87891 Personal history of nicotine dependence: Secondary | ICD-10-CM | POA: Diagnosis present

## 2022-01-10 ENCOUNTER — Other Ambulatory Visit: Payer: Self-pay | Admitting: *Deleted

## 2022-01-10 DIAGNOSIS — Z87891 Personal history of nicotine dependence: Secondary | ICD-10-CM

## 2022-01-10 DIAGNOSIS — F17210 Nicotine dependence, cigarettes, uncomplicated: Secondary | ICD-10-CM

## 2022-03-27 IMAGING — CT CT CHEST LUNG CANCER SCREENING LOW DOSE W/O CM
2 of 4 series · 15 of 36 positions shown, 18 images · non-contrast
Comparison: 08/05/2020 screening chest CT.

CLINICAL DATA: 65-year-old asymptomatic male current smoker with 93
pack-year smoking history.

EXAM:
CT CHEST WITHOUT CONTRAST LOW-DOSE FOR LUNG CANCER SCREENING
TECHNIQUE: Multidetector CT imaging of the chest was performed following the
standard protocol without IV contrast.

[Series 3: lungs · axial · 0.72mm/px · z∈[-179,+126]mm · 12 of 337 slices shown, 15 images]
[im 16/337  mediastinal]
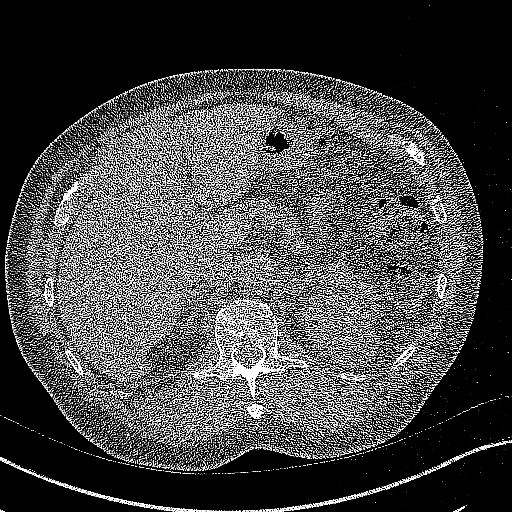
[im 16/337  lung]
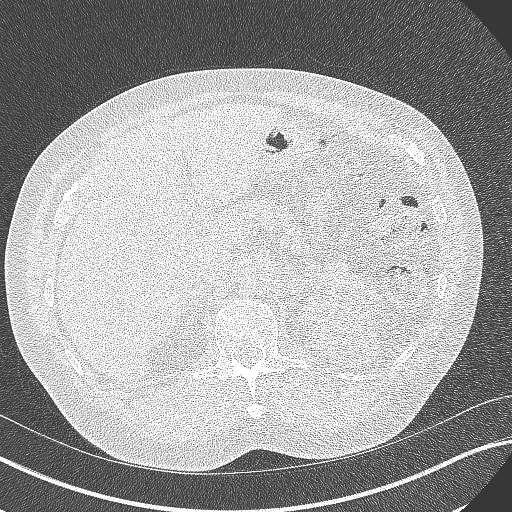
[im 46/337  lung]
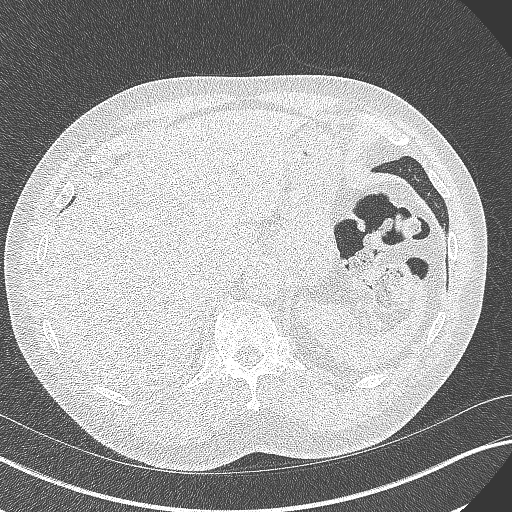
[im 77/337  lung]
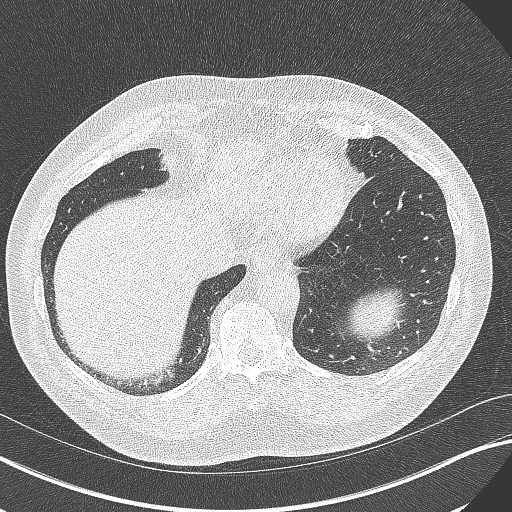
[im 107/337  lung]
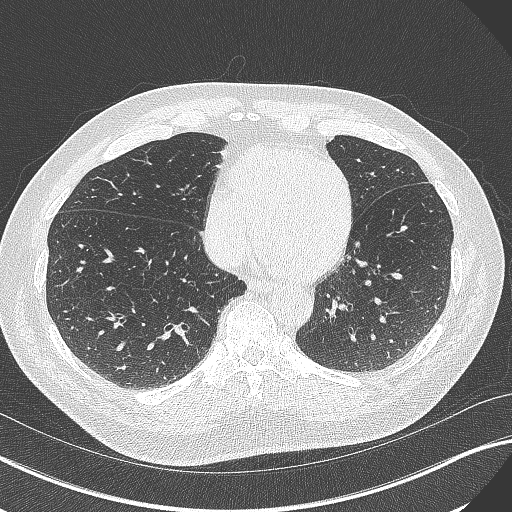
[im 123/337  mediastinal]
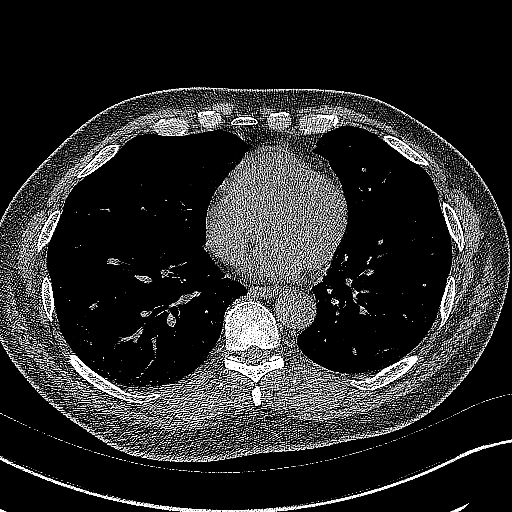
[im 123/337  lung]
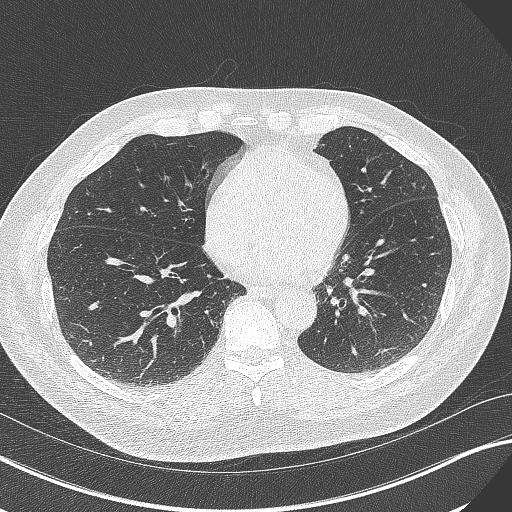
[im 153/337  lung]
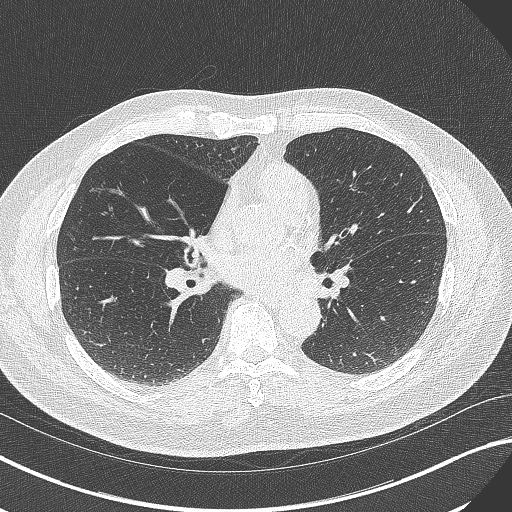
[im 184/337  lung]
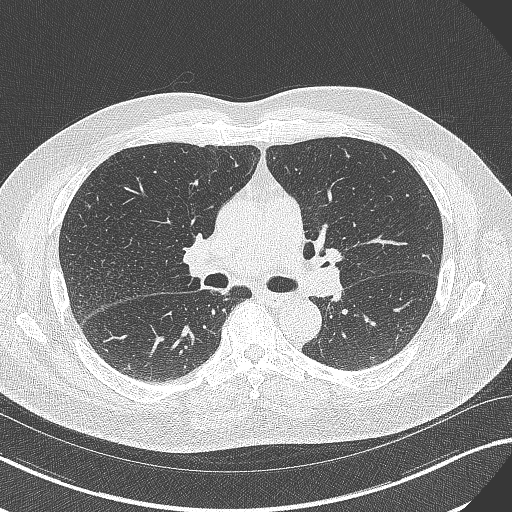
[im 214/337  lung]
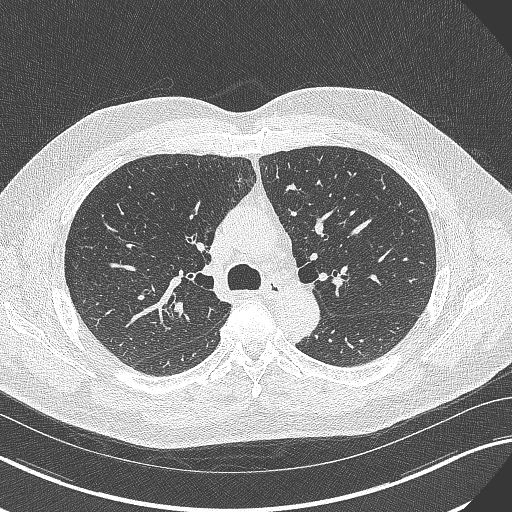
[im 230/337  mediastinal]
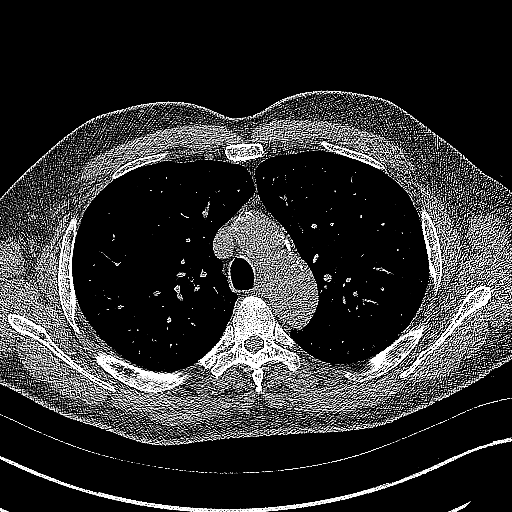
[im 230/337  lung]
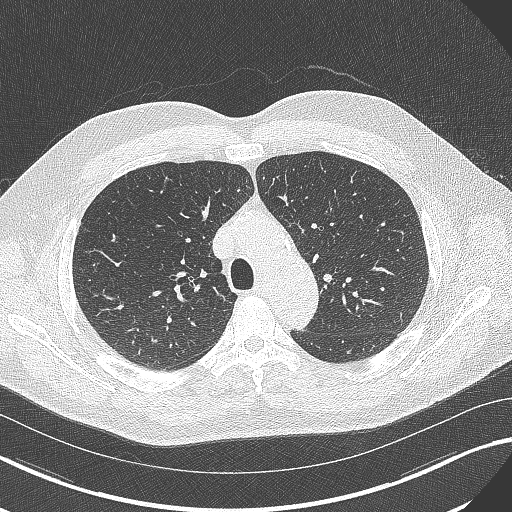
[im 260/337  lung]
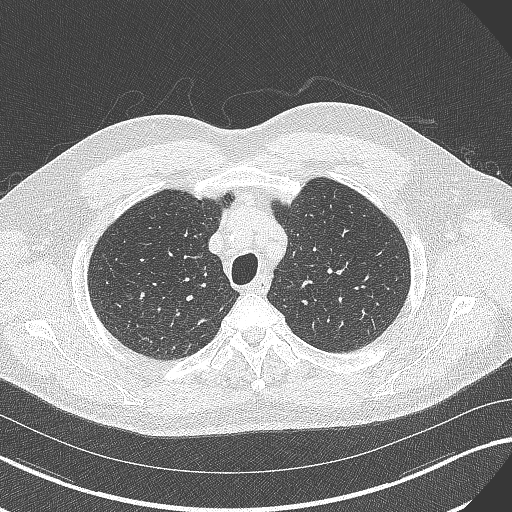
[im 291/337  lung]
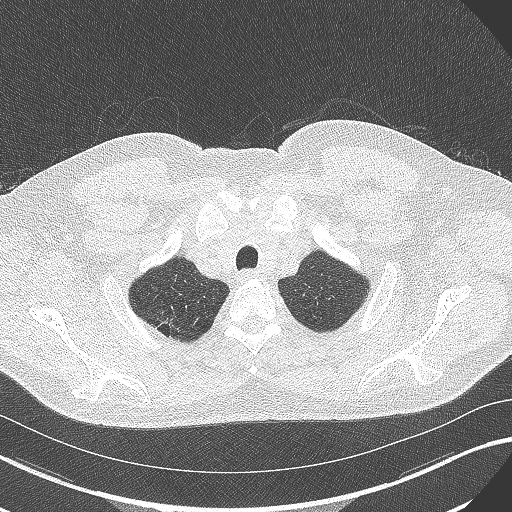
[im 321/337  lung]
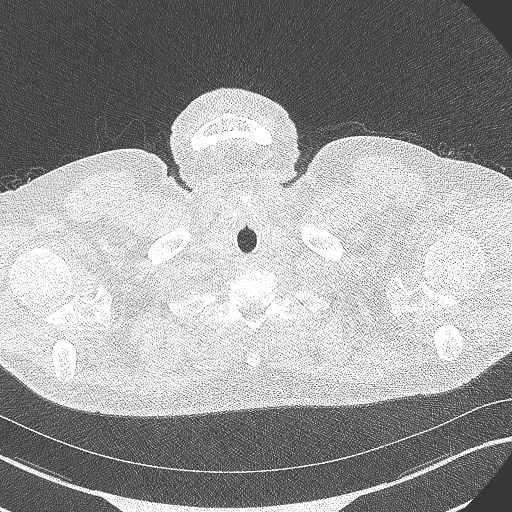

[Series 4: coronal · coronal · 0.69mm/px · 3 of 274 slices shown]
[im 55/274  lung]
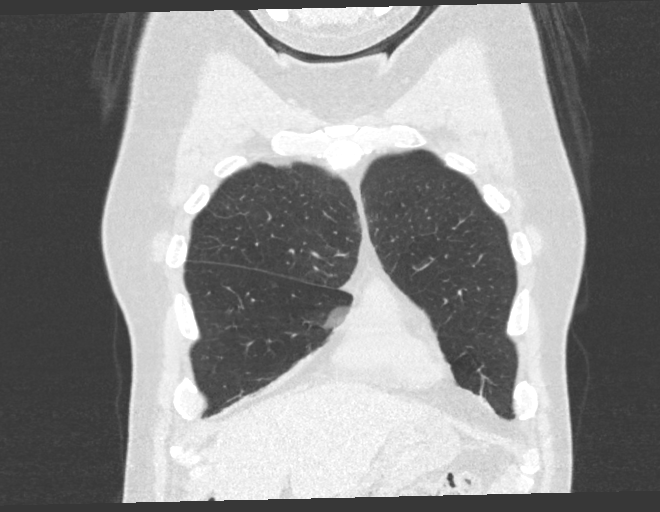
[im 110/274  lung]
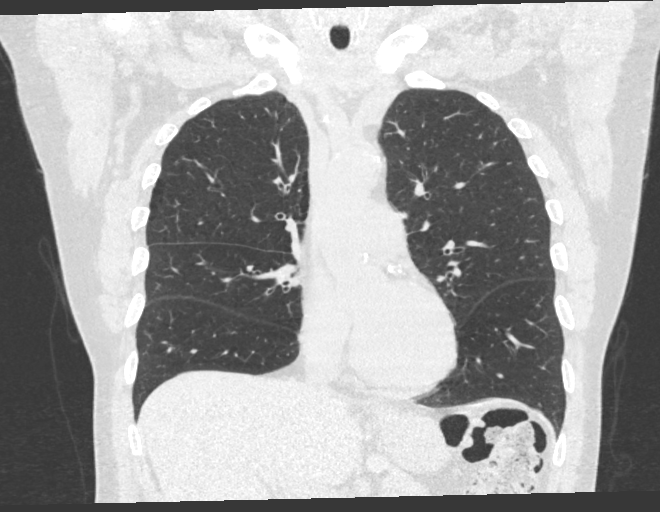
[im 164/274  lung]
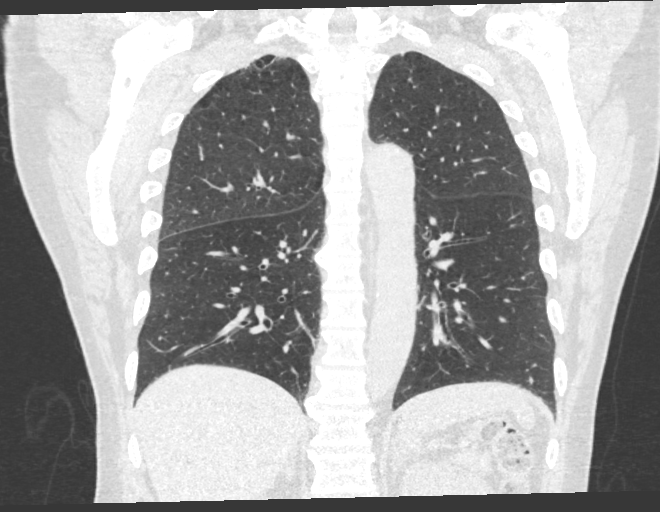

[15 of 36 positions shown; findings below may reference images not displayed]

FINDINGS: Cardiovascular: Normal heart size. No significant pericardial
effusion/thickening. Three-vessel coronary atherosclerosis.
Atherosclerotic nonaneurysmal thoracic aorta. Normal caliber
pulmonary arteries.

Mediastinum/Nodes: No discrete thyroid nodules. Unremarkable
esophagus. No pathologically enlarged axillary, mediastinal or hilar
lymph nodes, noting limited sensitivity for the detection of hilar
adenopathy on this noncontrast study.

Lungs/Pleura: No pneumothorax. No pleural effusion. Mild-to-moderate
centrilobular and paraseptal emphysema with diffuse bronchial wall
thickening. No acute consolidative airspace disease or lung masses.
Stable posterior right lower lobe solid pulmonary nodule. No new
significant pulmonary nodules.

Upper abdomen: Left adrenal 2.0 cm nodule with density 10 HU,
stable, compatible with an adenoma.

Musculoskeletal: No aggressive appearing focal osseous lesions. Mild
thoracic spondylosis.
IMPRESSION: 1. Lung-RADS 2, benign appearance or behavior. Continue annual
screening with low-dose chest CT without contrast in 12 months.
2. Three-vessel coronary atherosclerosis.
3. Stable left adrenal adenoma.
4. Aortic Atherosclerosis (92BIU-ZBC.C) and Emphysema (92BIU-O9W.B).

## 2022-04-02 ENCOUNTER — Other Ambulatory Visit: Payer: Self-pay | Admitting: Emergency Medicine

## 2022-04-02 DIAGNOSIS — D72829 Elevated white blood cell count, unspecified: Secondary | ICD-10-CM

## 2022-04-05 ENCOUNTER — Other Ambulatory Visit: Payer: Self-pay

## 2022-04-05 ENCOUNTER — Inpatient Hospital Stay (HOSPITAL_BASED_OUTPATIENT_CLINIC_OR_DEPARTMENT_OTHER): Payer: 59 | Admitting: Oncology

## 2022-04-05 ENCOUNTER — Encounter: Payer: Self-pay | Admitting: Oncology

## 2022-04-05 ENCOUNTER — Inpatient Hospital Stay: Payer: 59 | Attending: Oncology

## 2022-04-05 VITALS — BP 145/85 | HR 83 | Temp 96.2°F | Resp 16 | Ht 70.0 in | Wt 219.8 lb

## 2022-04-05 DIAGNOSIS — J329 Chronic sinusitis, unspecified: Secondary | ICD-10-CM | POA: Insufficient documentation

## 2022-04-05 DIAGNOSIS — F17210 Nicotine dependence, cigarettes, uncomplicated: Secondary | ICD-10-CM | POA: Diagnosis not present

## 2022-04-05 DIAGNOSIS — Z79899 Other long term (current) drug therapy: Secondary | ICD-10-CM | POA: Diagnosis not present

## 2022-04-05 DIAGNOSIS — D72829 Elevated white blood cell count, unspecified: Secondary | ICD-10-CM

## 2022-04-05 DIAGNOSIS — Z720 Tobacco use: Secondary | ICD-10-CM

## 2022-04-05 LAB — COMPREHENSIVE METABOLIC PANEL
ALT: 15 U/L (ref 0–44)
AST: 18 U/L (ref 15–41)
Albumin: 3.7 g/dL (ref 3.5–5.0)
Alkaline Phosphatase: 73 U/L (ref 38–126)
Anion gap: 8 (ref 5–15)
BUN: 13 mg/dL (ref 8–23)
CO2: 25 mmol/L (ref 22–32)
Calcium: 8.8 mg/dL — ABNORMAL LOW (ref 8.9–10.3)
Chloride: 103 mmol/L (ref 98–111)
Creatinine, Ser: 0.95 mg/dL (ref 0.61–1.24)
GFR, Estimated: >60 mL/min (ref >60)
Glucose, Bld: 94 mg/dL (ref 70–99)
Potassium: 3.9 mmol/L (ref 3.5–5.1)
Sodium: 136 mmol/L (ref 135–145)
Total Bilirubin: 0.3 mg/dL (ref 0.3–1.2)
Total Protein: 7.4 g/dL (ref 6.5–8.1)

## 2022-04-05 LAB — CBC WITH DIFFERENTIAL/PLATELET
Abs Immature Granulocytes: 0.07 10*3/uL (ref 0.00–0.07)
Basophils Absolute: 0.1 10*3/uL (ref 0.0–0.1)
Basophils Relative: 1 %
Eosinophils Absolute: 0.4 10*3/uL (ref 0.0–0.5)
Eosinophils Relative: 3 %
HCT: 46.8 % (ref 39.0–52.0)
Hemoglobin: 15.5 g/dL (ref 13.0–17.0)
Immature Granulocytes: 1 %
Lymphocytes Relative: 18 %
Lymphs Abs: 2.5 10*3/uL (ref 0.7–4.0)
MCH: 29 pg (ref 26.0–34.0)
MCHC: 33.1 g/dL (ref 30.0–36.0)
MCV: 87.6 fL (ref 80.0–100.0)
Monocytes Absolute: 1.2 10*3/uL — ABNORMAL HIGH (ref 0.1–1.0)
Monocytes Relative: 8 %
Neutro Abs: 10 10*3/uL — ABNORMAL HIGH (ref 1.7–7.7)
Neutrophils Relative %: 69 %
Platelets: 255 10*3/uL (ref 150–400)
RBC: 5.34 MIL/uL (ref 4.22–5.81)
RDW: 15.4 % (ref 11.5–15.5)
WBC: 14.3 10*3/uL — ABNORMAL HIGH (ref 4.0–10.5)
nRBC: 0 % (ref 0.0–0.2)

## 2022-04-05 NOTE — Progress Notes (Signed)
?Hematology/Oncology Progress note ?Telephone:(336) [phone] [phone] ?  ? ? ? ?Patient Care Team: ?Tracie Harrier, MD as PCP - General (Internal Medicine) ? ?REFERRING PROVIDER: ?Tracie Harrier, MD  ?CHIEF COMPLAINTS/REASON FOR VISIT:  ?Leukocytosis ? ?HISTORY OF PRESENTING ILLNESS:  ?Austin Nunez is a  66 y.o.  male with PMH listed below who was referred to me for evaluation of leukocytosis ?Reviewed patient' recent labs obtained by PCP.  ? ?08/12/2021 CBC showed elevated white count of 14.3, predominantly neutrophilia and basophilia.  ?Previous lab records reviewed. Leukocytosis onset of chronic, duration is since at least March 2019.  ? ?No aggravating or elevated factors. ?Associated symptoms or signs: denies ?Denies weight loss, fever, chills,night sweats, fatigue ?Smoking history: every day smoker, 2 packs per day, 92 pack year smoking history ?History of recent oral steroid use or steroid injection: denies ?History of recent infection: denies ?Autoimmune disease history: denies ? ?INTERVAL HISTORY ?Austin Nunez is a 66 y.o. male who has above history reviewed by me today presents for follow up visit for leukocytosis ?Patient reports feeling well.  He continues to get chronic sinus infection.  Recently worse with pollen. ?Denies any unintentional weight loss, fever, night sweats. ?Current everyday smoker. ? ?Review of Systems  ?Constitutional:  Negative for appetite change, chills, fatigue, fever and unexpected weight change.  ?HENT:   Negative for hearing loss, trouble swallowing and voice change.   ?     Sinus problem  ?Eyes:  Negative for eye problems and icterus.  ?Respiratory:  Negative for chest tightness, cough and shortness of breath.   ?Cardiovascular:  Negative for chest pain and leg swelling.  ?Gastrointestinal:  Negative for abdominal distention and abdominal pain.  ?Endocrine: Negative for hot flashes.  ?Genitourinary:  Negative for difficulty urinating, dysuria and frequency.    ?Musculoskeletal:  Negative for arthralgias.  ?Skin:  Negative for itching and rash.  ?Neurological:  Negative for light-headedness and numbness.  ?Hematological:  Negative for adenopathy. Does not bruise/bleed easily.  ?Psychiatric/Behavioral:  Negative for confusion.   ? ? ?MEDICAL HISTORY:  ?Past Medical History:  ?Diagnosis Date  ? COPD (chronic obstructive pulmonary disease) ([place])   ? Essential hypertension   ? Pulmonary hyperinflation   ? Tobacco user   ? ? ?SURGICAL HISTORY: ?Past Surgical History:  ?Procedure Laterality Date  ? colonoscopy  03/05/2019  ? hyperplastic colon polyp/ repeat 10 yrs  ? repair malunion/ non union femur right 1989    ? UPPER GI ENDOSCOPY    ? ? ?SOCIAL HISTORY: ?Social History  ? ?Socioeconomic History  ? Marital status: Single  ?  Spouse name: Not on file  ? Number of children: Not on file  ? Years of education: Not on file  ? Highest education level: Not on file  ?Occupational History  ? Not on file  ?Tobacco Use  ? Smoking status: Every Day  ?  Packs/day: 2.00  ?  Years: 46.00  ?  Pack years: 92.00  ?  Types: Cigarettes  ? Smokeless tobacco: Never  ?Substance and Sexual Activity  ? Alcohol use: No  ? Drug use: Not on file  ? Sexual activity: Not on file  ?Other Topics Concern  ? Not on file  ?Social History Narrative  ? Not on file  ? ?Social Determinants of Health  ? ?Financial Resource Strain: Not on file  ?Food Insecurity: Not on file  ?Transportation Needs: Not on file  ?Physical Activity: Not on file  ?Stress: Not on file  ?Social Connections: Not on  file  ?Intimate Partner Violence: Not on file  ? ? ?FAMILY HISTORY: ?No family history on file. ? ?ALLERGIES:  is allergic to aspirin. ? ?MEDICATIONS:  ?Current Outpatient Medications  ?Medication Sig Dispense Refill  ? amLODipine (NORVASC) 10 MG tablet Take 10 mg by mouth daily.    ? budesonide-formoterol (SYMBICORT) 160-4.5 MCG/ACT inhaler Inhale into the lungs.    ? olmesartan (BENICAR) 40 MG tablet Take 40 mg by mouth  daily.    ? ?No current facility-administered medications for this visit.  ? ? ? ?PHYSICAL EXAMINATION: ?ECOG PERFORMANCE STATUS: 0 - Asymptomatic ?Vitals:  ? 04/05/22 0845  ?BP: (!) 145/85  ?Pulse: 83  ?Resp: 16  ?Temp: (!) 96.2 ?F (35.7 ?C)  ? ?Filed Weights  ? 04/05/22 0845  ?Weight: 219 lb 12.8 oz (99.7 kg)  ? ? ?Physical Exam ?Constitutional:   ?   General: He is not in acute distress. ?HENT:  ?   Head: Normocephalic and atraumatic.  ?Eyes:  ?   General: No scleral icterus. ?Cardiovascular:  ?   Rate and Rhythm: Normal rate and regular rhythm.  ?   Heart sounds: Normal heart sounds.  ?Pulmonary:  ?   Effort: Pulmonary effort is normal. No respiratory distress.  ?   Breath sounds: No wheezing.  ?   Comments: Severely decreased breath sound bilaterally.  ?Abdominal:  ?   General: Bowel sounds are normal. There is no distension.  ?   Palpations: Abdomen is soft.  ?Musculoskeletal:     ?   General: No deformity. Normal range of motion.  ?   Cervical back: Normal range of motion and neck supple.  ?Skin: ?   General: Skin is warm and dry.  ?   Findings: No erythema or rash.  ?Neurological:  ?   Mental Status: He is alert and oriented to person, place, and time. Mental status is at baseline.  ?   Cranial Nerves: No cranial nerve deficit.  ?   Coordination: Coordination normal.  ?Psychiatric:     ?   Mood and Affect: Mood normal.  ? ? ? ?  Latest Ref Rng & Units 04/05/2022  ?  8:35 AM  ?CMP  ?Glucose 70 - 99 mg/dL 94    ?BUN 8 - 23 mg/dL 13    ?Creatinine 0.61 - 1.24 mg/dL 0.95    ?Sodium 135 - 145 mmol/L 136    ?Potassium 3.5 - 5.1 mmol/L 3.9    ?Chloride 98 - 111 mmol/L 103    ?CO2 22 - 32 mmol/L 25    ?Calcium 8.9 - 10.3 mg/dL 8.8    ?Total Protein 6.5 - 8.1 g/dL 7.4    ?Total Bilirubin 0.3 - 1.2 mg/dL 0.3    ?Alkaline Phos 38 - 126 U/L 73    ?AST 15 - 41 U/L 18    ?ALT 0 - 44 U/L 15    ? ? ?  Latest Ref Rng & Units 04/05/2022  ?  8:35 AM  ?CBC  ?WBC 4.0 - 10.5 K/uL 14.3    ?Hemoglobin 13.0 - 17.0 g/dL 15.5     ?Hematocrit 39.0 - 52.0 % 46.8    ?Platelets 150 - 400 K/uL 255    ? ? ? ?RADIOGRAPHIC STUDIES: ?I have personally reviewed the radiological images as listed and agreed with the findings in the report. ?No results found. ? ?LABORATORY DATA:  ?I have reviewed the data as listed ?Lab Results  ?Component Value Date  ? WBC 14.3 (H) 04/05/2022  ?  HGB 15.5 04/05/2022  ? HCT 46.8 04/05/2022  ? MCV 87.6 04/05/2022  ? PLT 255 04/05/2022  ? ?Recent Labs  ?  09/01/21 ?1019 04/05/22 ?0835  ?NA 136 136  ?K 3.9 3.9  ?CL 105 103  ?CO2 24 25  ?GLUCOSE 92 94  ?BUN 10 13  ?CREATININE 0.90 0.95  ?CALCIUM 8.7* 8.8*  ?GFRNONAA >60 >60  ?PROT 7.8 7.4  ?ALBUMIN 3.9 3.7  ?AST 21 18  ?ALT 18 15  ?ALKPHOS 80 73  ?BILITOT 0.7 0.3  ? ? ?Iron/TIBC/Ferritin/ %Sat ?No results found for: IRON, TIBC, FERRITIN, IRONPCTSAT  ? ? ? ? ?ASSESSMENT & PLAN:  ?1. Leukocytosis, unspecified type   ?2. Tobacco user   ?3. Chronic sinusitis, unspecified location   ?4. Hypocalcemia   ? ?#Leukocytosis  ?Previous work-up showed multiple myeloma panel is negative.  LDH normal.JAK2 V617F mutation negative, with reflex to other mutations CALR, MPL, JAK 2 Ex 12-15 mutations negative.BCR ABL1 negative. ?Flow cytometry negative. 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  This is nonspecific, could be secondary to reactive etiology versus neoplasm. ?I think leukocytosis most likely reactive in nature, secondary to chronic sinusitis, smoking. ?Currently patient has no constitutional symptoms.  I recommend observation. ? ? ?#Chronic sinus infection, refer to ENT Dr. Pryor Ochoa for further evaluation. ? ? ?#Tobacco use, smoke cessation was discussed. ?He is up-to-date for lung cancer screening.  11/10/2021. ? ?Mild hypocalcemia, calcium 8.8.  Continue calcium supplementation. ?  ?Follow-up in 6 months. ?Orders Placed This Encounter  ?Procedures  ? CBC with Differential/Platelet  ?  Standing Status:   Future  ?  Standing Expiration Date:   04/06/2023  ? Comprehensive metabolic  panel  ?  Standing Status:   Future  ?  Standing Expiration Date:   04/06/2023  ? Lactate dehydrogenase  ?  Standing Status:   Future  ?  Standing Expiration Date:   04/06/2023  ? Ambulatory referral to ENT  ?  [place]

## 2022-04-09 LAB — COMP PANEL: LEUKEMIA/LYMPHOMA

## 2022-04-14 ENCOUNTER — Telehealth: Payer: Self-pay | Admitting: Emergency Medicine

## 2022-04-14 NOTE — Telephone Encounter (Signed)
Referral to ENT with Dr. Andee Poles sent on 4/18. Pt has appt on 05/24/22 at 9am ?

## 2022-10-05 ENCOUNTER — Encounter: Payer: Self-pay | Admitting: Oncology

## 2022-10-05 ENCOUNTER — Inpatient Hospital Stay: Payer: 59 | Attending: Oncology

## 2022-10-05 ENCOUNTER — Inpatient Hospital Stay (HOSPITAL_BASED_OUTPATIENT_CLINIC_OR_DEPARTMENT_OTHER): Payer: 59 | Admitting: Oncology

## 2022-10-05 VITALS — BP 143/88 | HR 77 | Temp 97.6°F | Resp 18 | Wt 212.8 lb

## 2022-10-05 DIAGNOSIS — I10 Essential (primary) hypertension: Secondary | ICD-10-CM | POA: Insufficient documentation

## 2022-10-05 DIAGNOSIS — Z79899 Other long term (current) drug therapy: Secondary | ICD-10-CM | POA: Diagnosis not present

## 2022-10-05 DIAGNOSIS — J449 Chronic obstructive pulmonary disease, unspecified: Secondary | ICD-10-CM | POA: Diagnosis not present

## 2022-10-05 DIAGNOSIS — D72829 Elevated white blood cell count, unspecified: Secondary | ICD-10-CM | POA: Diagnosis present

## 2022-10-05 DIAGNOSIS — Z720 Tobacco use: Secondary | ICD-10-CM

## 2022-10-05 DIAGNOSIS — F17210 Nicotine dependence, cigarettes, uncomplicated: Secondary | ICD-10-CM | POA: Diagnosis not present

## 2022-10-05 LAB — CBC WITH DIFFERENTIAL/PLATELET
Abs Immature Granulocytes: 0.06 10*3/uL (ref 0.00–0.07)
Basophils Absolute: 0.1 10*3/uL (ref 0.0–0.1)
Basophils Relative: 1 %
Eosinophils Absolute: 0.6 10*3/uL — ABNORMAL HIGH (ref 0.0–0.5)
Eosinophils Relative: 4 %
HCT: 51 % (ref 39.0–52.0)
Hemoglobin: 17 g/dL (ref 13.0–17.0)
Immature Granulocytes: 0 %
Lymphocytes Relative: 18 %
Lymphs Abs: 2.5 10*3/uL (ref 0.7–4.0)
MCH: 29.1 pg (ref 26.0–34.0)
MCHC: 33.3 g/dL (ref 30.0–36.0)
MCV: 87.3 fL (ref 80.0–100.0)
Monocytes Absolute: 0.9 10*3/uL (ref 0.1–1.0)
Monocytes Relative: 7 %
Neutro Abs: 9.4 10*3/uL — ABNORMAL HIGH (ref 1.7–7.7)
Neutrophils Relative %: 70 %
Platelets: 246 10*3/uL (ref 150–400)
RBC: 5.84 MIL/uL — ABNORMAL HIGH (ref 4.22–5.81)
RDW: 15.5 % (ref 11.5–15.5)
WBC: 13.6 10*3/uL — ABNORMAL HIGH (ref 4.0–10.5)
nRBC: 0 % (ref 0.0–0.2)

## 2022-10-05 LAB — COMPREHENSIVE METABOLIC PANEL
ALT: 17 U/L (ref 0–44)
AST: 23 U/L (ref 15–41)
Albumin: 3.9 g/dL (ref 3.5–5.0)
Alkaline Phosphatase: 76 U/L (ref 38–126)
Anion gap: 5 (ref 5–15)
BUN: 8 mg/dL (ref 8–23)
CO2: 26 mmol/L (ref 22–32)
Calcium: 9.1 mg/dL (ref 8.9–10.3)
Chloride: 107 mmol/L (ref 98–111)
Creatinine, Ser: 0.93 mg/dL (ref 0.61–1.24)
GFR, Estimated: >60 mL/min (ref >60)
Glucose, Bld: 94 mg/dL (ref 70–99)
Potassium: 4.2 mmol/L (ref 3.5–5.1)
Sodium: 138 mmol/L (ref 135–145)
Total Bilirubin: 0.7 mg/dL (ref 0.3–1.2)
Total Protein: 7.9 g/dL (ref 6.5–8.1)

## 2022-10-05 LAB — LACTATE DEHYDROGENASE: LDH: 165 U/L (ref 98–192)

## 2022-10-05 NOTE — Assessment & Plan Note (Addendum)
Smoke cessation was discussed with patient. Continue lung cancer screening CT annually.

## 2022-10-05 NOTE — Progress Notes (Signed)
Hematology/Oncology Progress note [phone] [phone]      Patient Care Team: Tracie Harrier, MD as PCP - General (Internal Medicine)  ASSESSMENT & PLAN:   Leukocytosis #Leukocytosis  Previous work-up showed multiple myeloma panel is negative.  LDH normal.JAK2 V617F mutation negative, with reflex to other mutations CALR, MPL, JAK 2 Ex 12-15 mutations negative.BCR ABL1 negative. Flow cytometry negative. 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  This is nonspecific, could be secondary to reactive etiology versus neoplasm. Clinically leukocytosis most likely reactive in nature, secondary to chronic sinusitis, smoking. patient has no constitutional symptoms.  I recommend observation.  Tobacco user Smoke cessation was discussed with patient. Continue lung cancer screening CT annually.  Orders Placed This Encounter  Procedures   CBC with Differential/Platelet    Standing Status:   Future    Standing Expiration Date:   10/06/2023   Comprehensive metabolic panel    Standing Status:   Future    Standing Expiration Date:   10/06/2023   Lactate dehydrogenase    Standing Status:   Future    Standing Expiration Date:   10/06/2023   Follow-up in 1 year All questions were answered. The patient knows to call the clinic with any problems, questions or concerns.  Earlie Server, MD, PhD [facility] Hematology Oncology 10/05/2022   CHIEF COMPLAINTS/REASON FOR VISIT:  Leukocytosis  HISTORY OF PRESENTING ILLNESS:  Austin Nunez is a  66 y.o.  male with PMH listed below who was referred to me for evaluation of leukocytosis Reviewed patient' recent labs obtained by PCP.   08/12/2021 CBC showed elevated white count of 14.3, predominantly neutrophilia and basophilia.  Previous lab records reviewed. Leukocytosis onset of chronic, duration is since at least March 2019.   No aggravating or elevated factors. Associated symptoms or signs: denies Denies weight loss,  fever, chills,night sweats, fatigue Smoking history: every day smoker, 2 packs per day, 92 pack year smoking history History of recent oral steroid use or steroid injection: denies History of recent infection: denies Autoimmune disease history: denies  INTERVAL HISTORY Austin Nunez is a 66 y.o. male who has above history reviewed by me today presents for follow up visit for leukocytosis Patient reports feeling well.  He continues to get chronic sinus infection.   He smokes daily.  1 PPD Denies any unintentional weight loss, fever He has noticed  episodes of sweats, He attributes to high room temperature since he turns on the heating.  Family members also have sweating episodes. He has intentionally lost a few pounds by cutting back on sweets.  Review of Systems  Constitutional:  Negative for appetite change, chills, fatigue, fever and unexpected weight change.  HENT:   Negative for hearing loss, trouble swallowing and voice change.        Sinus problem  Eyes:  Negative for eye problems and icterus.  Respiratory:  Negative for chest tightness, cough and shortness of breath.   Cardiovascular:  Negative for chest pain and leg swelling.  Gastrointestinal:  Negative for abdominal distention and abdominal pain.  Endocrine: Negative for hot flashes.  Genitourinary:  Negative for difficulty urinating, dysuria and frequency.   Musculoskeletal:  Negative for arthralgias.  Skin:  Negative for itching and rash.  Neurological:  Negative for light-headedness and numbness.  Hematological:  Negative for adenopathy. Does not bruise/bleed easily.  Psychiatric/Behavioral:  Negative for confusion.      MEDICAL HISTORY:  Past Medical History:  Diagnosis Date   COPD (chronic obstructive pulmonary disease) ([place])  Essential hypertension    Pulmonary hyperinflation    Tobacco user     SURGICAL HISTORY: Past Surgical History:  Procedure Laterality Date   colonoscopy  03/05/2019   hyperplastic  colon polyp/ repeat 10 yrs   repair malunion/ non union femur right 1989     UPPER GI ENDOSCOPY      SOCIAL HISTORY: Social History   Socioeconomic History   Marital status: Single    Spouse name: Not on file   Number of children: Not on file   Years of education: Not on file   Highest education level: Not on file  Occupational History   Not on file  Tobacco Use   Smoking status: Every Day    Packs/day: 2.00    Years: 46.00    Total pack years: 92.00    Types: Cigarettes   Smokeless tobacco: Never  Substance and Sexual Activity   Alcohol use: No   Drug use: Not on file   Sexual activity: Not on file  Other Topics Concern   Not on file  Social History Narrative   Not on file   Social Determinants of Health   Financial Resource Strain: Not on file  Food Insecurity: Not on file  Transportation Needs: Not on file  Physical Activity: Not on file  Stress: Not on file  Social Connections: Not on file  Intimate Partner Violence: Not on file    FAMILY HISTORY: History reviewed. No pertinent family history.  ALLERGIES:  is allergic to aspirin.  MEDICATIONS:  Current Outpatient Medications  Medication Sig Dispense Refill   amLODipine (NORVASC) 10 MG tablet Take 10 mg by mouth daily.     budesonide-formoterol (SYMBICORT) 160-4.5 MCG/ACT inhaler Inhale into the lungs.     meloxicam (MOBIC) 7.5 MG tablet Take by mouth.     olmesartan (BENICAR) 40 MG tablet Take 40 mg by mouth daily.     omeprazole (PRILOSEC) 20 MG capsule Take 1 capsule by mouth daily.     No current facility-administered medications for this visit.     PHYSICAL EXAMINATION: ECOG PERFORMANCE STATUS: 0 - Asymptomatic Vitals:   10/05/22 1026  BP: (!) 143/88  Pulse: 77  Resp: 18  Temp: 97.6 F (36.4 C)   Filed Weights   10/05/22 1026  Weight: 212 lb 12.8 oz (96.5 kg)    Physical Exam Constitutional:      General: He is not in acute distress. HENT:     Head: Normocephalic and atraumatic.   Eyes:     General: No scleral icterus. Cardiovascular:     Rate and Rhythm: Normal rate and regular rhythm.     Heart sounds: Normal heart sounds.  Pulmonary:     Effort: Pulmonary effort is normal. No respiratory distress.     Breath sounds: No wheezing.     Comments: Severely decreased breath sound bilaterally.  Abdominal:     General: Bowel sounds are normal. There is no distension.     Palpations: Abdomen is soft.  Musculoskeletal:        General: No deformity. Normal range of motion.     Cervical back: Normal range of motion and neck supple.  Skin:    General: Skin is warm and dry.     Findings: No erythema or rash.  Neurological:     Mental Status: He is alert and oriented to person, place, and time. Mental status is at baseline.     Cranial Nerves: No cranial nerve deficit.     Coordination:  Coordination normal.  Psychiatric:        Mood and Affect: Mood normal.        Latest Ref Rng & Units 10/05/2022   10:12 AM  CMP  Glucose 70 - 99 mg/dL 94   BUN 8 - 23 mg/dL 8   Creatinine 0.61 - 1.24 mg/dL 0.93   Sodium 135 - 145 mmol/L 138   Potassium 3.5 - 5.1 mmol/L 4.2   Chloride 98 - 111 mmol/L 107   CO2 22 - 32 mmol/L 26   Calcium 8.9 - 10.3 mg/dL 9.1   Total Protein 6.5 - 8.1 g/dL 7.9   Total Bilirubin 0.3 - 1.2 mg/dL 0.7   Alkaline Phos 38 - 126 U/L 76   AST 15 - 41 U/L 23   ALT 0 - 44 U/L 17       Latest Ref Rng & Units 10/05/2022   10:12 AM  CBC  WBC 4.0 - 10.5 K/uL 13.6   Hemoglobin 13.0 - 17.0 g/dL 17.0   Hematocrit 39.0 - 52.0 % 51.0   Platelets 150 - 400 K/uL 246      RADIOGRAPHIC STUDIES: I have personally reviewed the radiological images as listed and agreed with the findings in the report. No results found.  LABORATORY DATA:  I have reviewed the data as listed     Latest Ref Rng & Units 10/05/2022   10:12 AM 04/05/2022    8:35 AM 09/01/2021   10:19 AM  CBC  WBC 4.0 - 10.5 K/uL 13.6  14.3  12.7   Hemoglobin 13.0 - 17.0 g/dL 17.0  15.5   16.2   Hematocrit 39.0 - 52.0 % 51.0  46.8  48.8   Platelets 150 - 400 K/uL 246  255  267       Latest Ref Rng & Units 10/05/2022   10:12 AM 04/05/2022    8:35 AM 09/01/2021   10:19 AM  CMP  Glucose 70 - 99 mg/dL 94  94  92   BUN 8 - 23 mg/dL 8  13  10    Creatinine 0.61 - 1.24 mg/dL 0.93  0.95  0.90   Sodium 135 - 145 mmol/L 138  136  136   Potassium 3.5 - 5.1 mmol/L 4.2  3.9  3.9   Chloride 98 - 111 mmol/L 107  103  105   CO2 22 - 32 mmol/L 26  25  24    Calcium 8.9 - 10.3 mg/dL 9.1  8.8  8.7   Total Protein 6.5 - 8.1 g/dL 7.9  7.4  7.8   Total Bilirubin 0.3 - 1.2 mg/dL 0.7  0.3  0.7   Alkaline Phos 38 - 126 U/L 76  73  80   AST 15 - 41 U/L 23  18  21    ALT 0 - 44 U/L 17  15  18

## 2022-10-05 NOTE — Progress Notes (Signed)
Pt here for follow up. No new concerns voiced.   

## 2022-10-05 NOTE — Assessment & Plan Note (Signed)
#  Leukocytosis  Previous work-up showed multiple myeloma panel is negative.  LDH normal.JAK2 V617F mutation negative, with reflex to other mutations CALR, MPL, JAK 2 Ex 12-15 mutations negative.BCR ABL1 negative. Flow cytometry negative. 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  This is nonspecific, could be secondary to reactive etiology versus neoplasm. Clinically leukocytosis most likely reactive in nature, secondary to chronic sinusitis, smoking. patient has no constitutional symptoms.  I recommend observation.

## 2022-11-09 ENCOUNTER — Ambulatory Visit: Admission: RE | Admit: 2022-11-09 | Payer: Managed Care, Other (non HMO) | Source: Ambulatory Visit

## 2022-11-15 ENCOUNTER — Ambulatory Visit
Admission: RE | Admit: 2022-11-15 | Discharge: 2022-11-15 | Disposition: A | Discharge status: Home / Self Care | Payer: Managed Care, Other (non HMO) | Source: Ambulatory Visit | Attending: Acute Care | Admitting: Acute Care

## 2022-11-15 DIAGNOSIS — Z87891 Personal history of nicotine dependence: Secondary | ICD-10-CM

## 2022-11-15 DIAGNOSIS — F17210 Nicotine dependence, cigarettes, uncomplicated: Secondary | ICD-10-CM

## 2022-11-19 ENCOUNTER — Telehealth: Payer: Self-pay | Admitting: Acute Care

## 2022-11-19 DIAGNOSIS — R911 Solitary pulmonary nodule: Secondary | ICD-10-CM

## 2022-11-19 DIAGNOSIS — Z87891 Personal history of nicotine dependence: Secondary | ICD-10-CM

## 2022-11-19 NOTE — Telephone Encounter (Signed)
I have called the patient with the results of his low-dose screening CT.  His scan was read as a lung RADS 3, probably benign. I explained that there is a new left lower lobe nodule measuring 5.8 mm that we wanted take a look at in 6 months rather than wait a full year.  He is in agreement with this plan. We also discussed that there was notation of coronary artery calcifications and aortic atherosclerosis.  The patient is not currently taking statin therapy.  I have encouraged him to talk with his primary care physician regarding these findings.  Denise please fax results to PCP, and place order for 32-month low-dose CT follow-up. Thanks so much   New left lower lobe solid pulmonary nodule measuring 5.8 mm. Lung-RADS 3, probably benign findings. Short-term follow-up in 6 months is recommended with repeat low-dose chest CT without contrast

## 2022-11-22 NOTE — Telephone Encounter (Signed)
CT results faxed to PCP with follow up plans included. Order placed for 6 mth nodule f/u [facility] CT.  

## 2023-05-17 ENCOUNTER — Inpatient Hospital Stay: Admission: RE | Admit: 2023-05-17 | Payer: Managed Care, Other (non HMO) | Source: Ambulatory Visit

## 2023-07-18 ENCOUNTER — Ambulatory Visit
Admission: RE | Admit: 2023-07-18 | Discharge: 2023-07-18 | Disposition: A | Discharge status: Home / Self Care | Payer: Medicare HMO | Source: Ambulatory Visit | Attending: Acute Care | Admitting: Acute Care

## 2023-07-18 DIAGNOSIS — R911 Solitary pulmonary nodule: Secondary | ICD-10-CM

## 2023-07-18 DIAGNOSIS — Z87891 Personal history of nicotine dependence: Secondary | ICD-10-CM

## 2023-07-25 ENCOUNTER — Other Ambulatory Visit: Payer: Self-pay | Admitting: Acute Care

## 2023-07-25 DIAGNOSIS — F17210 Nicotine dependence, cigarettes, uncomplicated: Secondary | ICD-10-CM

## 2023-07-25 DIAGNOSIS — Z87891 Personal history of nicotine dependence: Secondary | ICD-10-CM

## 2023-07-25 DIAGNOSIS — Z122 Encounter for screening for malignant neoplasm of respiratory organs: Secondary | ICD-10-CM

## 2023-10-03 ENCOUNTER — Encounter: Payer: Self-pay | Admitting: Oncology

## 2023-10-03 ENCOUNTER — Inpatient Hospital Stay: Payer: Medicare HMO | Attending: Oncology

## 2023-10-03 ENCOUNTER — Inpatient Hospital Stay (HOSPITAL_BASED_OUTPATIENT_CLINIC_OR_DEPARTMENT_OTHER): Payer: Medicare HMO | Admitting: Oncology

## 2023-10-03 VITALS — BP 138/81 | HR 87 | Temp 97.2°F | Resp 18 | Wt 220.0 lb

## 2023-10-03 DIAGNOSIS — R229 Localized swelling, mass and lump, unspecified: Secondary | ICD-10-CM

## 2023-10-03 DIAGNOSIS — R21 Rash and other nonspecific skin eruption: Secondary | ICD-10-CM

## 2023-10-03 DIAGNOSIS — Z720 Tobacco use: Secondary | ICD-10-CM

## 2023-10-03 DIAGNOSIS — F17210 Nicotine dependence, cigarettes, uncomplicated: Secondary | ICD-10-CM | POA: Insufficient documentation

## 2023-10-03 DIAGNOSIS — Z79899 Other long term (current) drug therapy: Secondary | ICD-10-CM | POA: Insufficient documentation

## 2023-10-03 DIAGNOSIS — D72829 Elevated white blood cell count, unspecified: Secondary | ICD-10-CM | POA: Insufficient documentation

## 2023-10-03 LAB — CBC WITH DIFFERENTIAL/PLATELET
Abs Immature Granulocytes: 0.06 10*3/uL (ref 0.00–0.07)
Basophils Absolute: 0.1 10*3/uL (ref 0.0–0.1)
Basophils Relative: 1 %
Eosinophils Absolute: 0.7 10*3/uL — ABNORMAL HIGH (ref 0.0–0.5)
Eosinophils Relative: 5 %
HCT: 48 % (ref 39.0–52.0)
Hemoglobin: 15.7 g/dL (ref 13.0–17.0)
Immature Granulocytes: 0 %
Lymphocytes Relative: 21 %
Lymphs Abs: 3 10*3/uL (ref 0.7–4.0)
MCH: 28.8 pg (ref 26.0–34.0)
MCHC: 32.7 g/dL (ref 30.0–36.0)
MCV: 87.9 fL (ref 80.0–100.0)
Monocytes Absolute: 1.2 10*3/uL — ABNORMAL HIGH (ref 0.1–1.0)
Monocytes Relative: 8 %
Neutro Abs: 9 10*3/uL — ABNORMAL HIGH (ref 1.7–7.7)
Neutrophils Relative %: 65 %
Platelets: 271 10*3/uL (ref 150–400)
RBC: 5.46 MIL/uL (ref 4.22–5.81)
RDW: 15.1 % (ref 11.5–15.5)
WBC: 14 10*3/uL — ABNORMAL HIGH (ref 4.0–10.5)
nRBC: 0 % (ref 0.0–0.2)

## 2023-10-03 LAB — COMPREHENSIVE METABOLIC PANEL WITH GFR
ALT: 12 U/L (ref 0–44)
AST: 17 U/L (ref 15–41)
Albumin: 3.8 g/dL (ref 3.5–5.0)
Alkaline Phosphatase: 77 U/L (ref 38–126)
Anion gap: 6 (ref 5–15)
BUN: 10 mg/dL (ref 8–23)
CO2: 24 mmol/L (ref 22–32)
Calcium: 8.9 mg/dL (ref 8.9–10.3)
Chloride: 105 mmol/L (ref 98–111)
Creatinine, Ser: 0.97 mg/dL (ref 0.61–1.24)
GFR, Estimated: >60 mL/min
Glucose, Bld: 67 mg/dL — ABNORMAL LOW (ref 70–99)
Potassium: 3.9 mmol/L (ref 3.5–5.1)
Sodium: 135 mmol/L (ref 135–145)
Total Bilirubin: 0.6 mg/dL (ref 0.3–1.2)
Total Protein: 7.9 g/dL (ref 6.5–8.1)

## 2023-10-03 LAB — LACTATE DEHYDROGENASE: LDH: 171 U/L (ref 98–192)

## 2023-10-03 NOTE — Assessment & Plan Note (Signed)
Smoke cessation was discussed with patient. Follow up with lung cancer screening program, CT annually.

## 2023-10-03 NOTE — Progress Notes (Signed)
Hematology/Oncology Progress note [phone] [phone]      Patient Care Team: Barbette Reichmann, MD as PCP - General (Internal Medicine) Rickard Patience, MD as Consulting Physician (Oncology)  ASSESSMENT & PLAN:   Leukocytosis #Leukocytosis  Previous work-up showed multiple myeloma panel is negative.  LDH normal.JAK2 V617F mutation negative, with reflex to other mutations CALR, MPL, JAK 2 Ex 12-15 mutations negative.BCR ABL1 negative. Flow cytometry negative. 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  This is nonspecific, could be secondary to reactive etiology versus neoplasm. Clinically he is doing well with no constitutional symptoms.  leukocytosis most likely reactive in nature, secondary to chronic sinusitis, smoking. I recommend observation.  Tobacco user Smoke cessation was discussed with patient. Follow up with lung cancer screening program, CT annually.  Multiple skin nodules Refer to dermatology for further evaluation.   Orders Placed This Encounter  Procedures   CBC with Differential (Cancer Center Only)    Standing Status:   Future    Standing Expiration Date:   10/02/2024   CMP (Cancer Center only)    Standing Status:   Future    Standing Expiration Date:   10/02/2024   Lactate dehydrogenase    Standing Status:   Future    Standing Expiration Date:   10/02/2024   Ambulatory referral to Dermatology    Referral Priority:   Routine    Referral Type:   Consultation    Referral Reason:   Specialty Services Required    Requested Specialty:   Dermatology    Number of Visits Requested:   1   Follow-up in 1 year All questions were answered. The patient knows to call the clinic with any problems, questions or concerns.  Rickard Patience, MD, PhD [facility] Hematology Oncology 10/03/2023   CHIEF COMPLAINTS/REASON FOR VISIT:  Leukocytosis  HISTORY OF PRESENTING ILLNESS:  Austin Nunez is a  67 y.o.  male with PMH listed below who was referred to me  for evaluation of leukocytosis Reviewed patient' recent labs obtained by PCP.   08/12/2021 CBC showed elevated white count of 14.3, predominantly neutrophilia and basophilia.  Previous lab records reviewed. Leukocytosis onset of chronic, duration is since at least March 2019.   No aggravating or elevated factors. Associated symptoms or signs: denies Denies weight loss, fever, chills,night sweats, fatigue Smoking history: every day smoker, 2 packs per day, 92 pack year smoking history History of recent oral steroid use or steroid injection: denies History of recent infection: denies Autoimmune disease history: denies  INTERVAL HISTORY Austin Nunez is a 67 y.o. male who has above history reviewed by me today presents for follow up visit for leukocytosis Patient reports feeling well.  He continues to get chronic sinus infection.   He smokes daily.  1 PPD Denies any unintentional weight loss, fever He has noticed  multiple lower extremity nodules for several months.   Review of Systems  Constitutional:  Negative for appetite change, chills, fatigue, fever and unexpected weight change.  HENT:   Negative for hearing loss, trouble swallowing and voice change.        Sinus problem  Eyes:  Negative for eye problems and icterus.  Respiratory:  Negative for chest tightness, cough and shortness of breath.   Cardiovascular:  Negative for chest pain and leg swelling.  Gastrointestinal:  Negative for abdominal distention and abdominal pain.  Endocrine: Negative for hot flashes.  Genitourinary:  Negative for difficulty urinating, dysuria and frequency.   Musculoskeletal:  Negative for arthralgias.  Skin:  Negative  for itching and rash.       Skin nodules on lower extremities.   Neurological:  Negative for light-headedness and numbness.  Hematological:  Negative for adenopathy. Does not bruise/bleed easily.  Psychiatric/Behavioral:  Negative for confusion.      MEDICAL HISTORY:  Past Medical  History:  Diagnosis Date   COPD (chronic obstructive pulmonary disease) ([facility])    Essential hypertension    Pulmonary hyperinflation    Tobacco user     SURGICAL HISTORY: Past Surgical History:  Procedure Laterality Date   colonoscopy  03/05/2019   hyperplastic colon polyp/ repeat 10 yrs   repair malunion/ non union femur right 1989     UPPER GI ENDOSCOPY      SOCIAL HISTORY: Social History   Socioeconomic History   Marital status: Single    Spouse name: Not on file   Number of children: Not on file   Years of education: Not on file   Highest education level: Not on file  Occupational History   Not on file  Tobacco Use   Smoking status: Every Day    Current packs/day: 2.00    Average packs/day: 2.0 packs/day for 46.0 years (92.0 ttl pk-yrs)    Types: Cigarettes   Smokeless tobacco: Never  Substance and Sexual Activity   Alcohol use: No   Drug use: Not on file   Sexual activity: Not on file  Other Topics Concern   Not on file  Social History Narrative   Not on file   Social Determinants of Health   Financial Resource Strain: Low Risk  (07/04/2023)   Received from [facility]   Overall Financial Resource Strain (CARDIA)    Difficulty of Paying Living Expenses: Not hard at all  Food Insecurity: No Food Insecurity (07/04/2023)   Received from [facility]   Hunger Vital Sign    Worried About Running Out of Food in the Last Year: Never true    Ran Out of Food in the Last Year: Never true  Transportation Needs: No Transportation Needs (07/04/2023)   Received from [facility] - Transportation    In the past 12 months, has lack of transportation kept you from medical appointments or from getting medications?: No    Lack of Transportation (Non-Medical): No  Physical Activity: Not on file  Stress: Not on file  Social Connections: Not on file  Intimate Partner Violence: Not on file    FAMILY  HISTORY: History reviewed. No pertinent family history.  ALLERGIES:  is allergic to aspirin.  MEDICATIONS:  Current Outpatient Medications  Medication Sig Dispense Refill   amLODipine (NORVASC) 10 MG tablet Take 10 mg by mouth daily.     budesonide-formoterol (SYMBICORT) 160-4.5 MCG/ACT inhaler Inhale into the lungs.     meloxicam (MOBIC) 15 MG tablet Take 15 mg by mouth daily as needed for pain.     olmesartan (BENICAR) 40 MG tablet Take 40 mg by mouth daily.     omeprazole (PRILOSEC) 20 MG capsule Take 1 capsule by mouth daily.     No current facility-administered medications for this visit.     PHYSICAL EXAMINATION: ECOG PERFORMANCE STATUS: 0 - Asymptomatic Vitals:   10/03/23 1030  BP: 138/81  Pulse: 87  Resp: 18  Temp: (!) 97.2 F (36.2 C)   Filed Weights   10/03/23 1030  Weight: 220 lb (99.8 kg)    Physical Exam Constitutional:      General: He is not  in acute distress. HENT:     Head: Normocephalic and atraumatic.  Eyes:     General: No scleral icterus. Cardiovascular:     Rate and Rhythm: Normal rate and regular rhythm.     Heart sounds: Normal heart sounds.  Pulmonary:     Effort: Pulmonary effort is normal. No respiratory distress.     Breath sounds: No wheezing.     Comments: Severely decreased breath sound bilaterally.  Abdominal:     General: Bowel sounds are normal. There is no distension.     Palpations: Abdomen is soft.  Musculoskeletal:        General: No deformity. Normal range of motion.     Cervical back: Normal range of motion and neck supple.  Skin:    General: Skin is warm and dry.     Findings: No erythema.     Comments: Bilateral lower extremities skin nodules.   Neurological:     Mental Status: He is alert and oriented to person, place, and time. Mental status is at baseline.     Cranial Nerves: No cranial nerve deficit.     Coordination: Coordination normal.  Psychiatric:        Mood and Affect: Mood normal.        Latest  Ref Rng & Units 10/03/2023   10:19 AM  CMP  Glucose 70 - 99 mg/dL 67   BUN 8 - 23 mg/dL 10   Creatinine 1.61 - 1.24 mg/dL 0.96   Sodium 045 - 409 mmol/L 135   Potassium 3.5 - 5.1 mmol/L 3.9   Chloride 98 - 111 mmol/L 105   CO2 22 - 32 mmol/L 24   Calcium 8.9 - 10.3 mg/dL 8.9   Total Protein 6.5 - 8.1 g/dL 7.9   Total Bilirubin 0.3 - 1.2 mg/dL 0.6   Alkaline Phos 38 - 126 U/L 77   AST 15 - 41 U/L 17   ALT 0 - 44 U/L 12       Latest Ref Rng & Units 10/03/2023   10:19 AM  CBC  WBC 4.0 - 10.5 K/uL 14.0   Hemoglobin 13.0 - 17.0 g/dL 81.1   Hematocrit 91.4 - 52.0 % 48.0   Platelets 150 - 400 K/uL 271      RADIOGRAPHIC STUDIES: I have personally reviewed the radiological images as listed and agreed with the findings in the report. No results found.  LABORATORY DATA:  I have reviewed the data as listed     Latest Ref Rng & Units 10/03/2023   10:19 AM 10/05/2022   10:12 AM 04/05/2022    8:35 AM  CBC  WBC 4.0 - 10.5 K/uL 14.0  13.6  14.3   Hemoglobin 13.0 - 17.0 g/dL 78.2  95.6  21.3   Hematocrit 39.0 - 52.0 % 48.0  51.0  46.8   Platelets 150 - 400 K/uL 271  246  255       Latest Ref Rng & Units 10/03/2023   10:19 AM 10/05/2022   10:12 AM 04/05/2022    8:35 AM  CMP  Glucose 70 - 99 mg/dL 67  94  94   BUN 8 - 23 mg/dL 10  8  13    Creatinine 0.61 - 1.24 mg/dL 0.86  5.78  4.69   Sodium 135 - 145 mmol/L 135  138  136   Potassium 3.5 - 5.1 mmol/L 3.9  4.2  3.9   Chloride 98 - 111 mmol/L 105  107  103  CO2 22 - 32 mmol/L 24  26  25    Calcium 8.9 - 10.3 mg/dL 8.9  9.1  8.8   Total Protein 6.5 - 8.1 g/dL 7.9  7.9  7.4   Total Bilirubin 0.3 - 1.2 mg/dL 0.6  0.7  0.3   Alkaline Phos 38 - 126 U/L 77  76  73   AST 15 - 41 U/L 17  23  18    ALT 0 - 44 U/L 12  17  15

## 2023-10-03 NOTE — Assessment & Plan Note (Signed)
#  Leukocytosis  Previous work-up showed multiple myeloma panel is negative.  LDH normal.JAK2 V617F mutation negative, with reflex to other mutations CALR, MPL, JAK 2 Ex 12-15 mutations negative.BCR ABL1 negative. Flow cytometry negative. 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  This is nonspecific, could be secondary to reactive etiology versus neoplasm. Clinically he is doing well with no constitutional symptoms.  leukocytosis most likely reactive in nature, secondary to chronic sinusitis, smoking. I recommend observation.

## 2023-10-03 NOTE — Assessment & Plan Note (Signed)
Refer to dermatology for further evaluation.

## 2023-10-03 NOTE — Progress Notes (Signed)
Pt here for follow up. Pt reports that he has a rash to both legs, not improving.

## 2024-07-02 ENCOUNTER — Encounter: Payer: Self-pay | Admitting: Acute Care

## 2024-07-18 ENCOUNTER — Inpatient Hospital Stay: Admission: RE | Admit: 2024-07-18 | Source: Ambulatory Visit

## 2024-09-17 ENCOUNTER — Other Ambulatory Visit: Payer: Self-pay

## 2024-09-17 ENCOUNTER — Telehealth: Payer: Self-pay

## 2024-09-17 DIAGNOSIS — Z87891 Personal history of nicotine dependence: Secondary | ICD-10-CM

## 2024-09-17 DIAGNOSIS — F17210 Nicotine dependence, cigarettes, uncomplicated: Secondary | ICD-10-CM

## 2024-09-17 DIAGNOSIS — Z122 Encounter for screening for malignant neoplasm of respiratory organs: Secondary | ICD-10-CM

## 2024-09-17 NOTE — Telephone Encounter (Signed)
 [facility] called. Patient was scheduled for annual CT today however there was no order Appt. Cancelled. A new order has been placed. I LVM for patient to call and reschedule.

## 2024-09-18 ENCOUNTER — Inpatient Hospital Stay: Admission: RE | Admit: 2024-09-18 | Source: Ambulatory Visit

## 2024-09-18 ENCOUNTER — Ambulatory Visit

## 2024-09-18 ENCOUNTER — Ambulatory Visit
Admission: RE | Admit: 2024-09-18 | Discharge: 2024-09-18 | Disposition: A | Discharge status: Home / Self Care | Source: Ambulatory Visit | Attending: Acute Care | Admitting: Acute Care

## 2024-09-18 DIAGNOSIS — Z87891 Personal history of nicotine dependence: Secondary | ICD-10-CM

## 2024-09-18 DIAGNOSIS — Z122 Encounter for screening for malignant neoplasm of respiratory organs: Secondary | ICD-10-CM

## 2024-09-18 DIAGNOSIS — F17210 Nicotine dependence, cigarettes, uncomplicated: Secondary | ICD-10-CM

## 2024-09-24 ENCOUNTER — Other Ambulatory Visit: Payer: Self-pay | Admitting: Acute Care

## 2024-09-24 DIAGNOSIS — F17210 Nicotine dependence, cigarettes, uncomplicated: Secondary | ICD-10-CM

## 2024-09-24 DIAGNOSIS — Z87891 Personal history of nicotine dependence: Secondary | ICD-10-CM

## 2024-09-24 DIAGNOSIS — Z122 Encounter for screening for malignant neoplasm of respiratory organs: Secondary | ICD-10-CM

## 2024-10-03 ENCOUNTER — Encounter: Payer: Self-pay | Admitting: Oncology

## 2024-10-03 ENCOUNTER — Inpatient Hospital Stay: Payer: Medicare HMO | Admitting: Oncology

## 2024-10-03 ENCOUNTER — Inpatient Hospital Stay: Payer: Medicare HMO | Attending: Oncology

## 2024-10-03 VITALS — BP 143/81 | HR 72 | Temp 96.8°F | Resp 18 | Wt 231.5 lb

## 2024-10-03 DIAGNOSIS — Z79899 Other long term (current) drug therapy: Secondary | ICD-10-CM | POA: Diagnosis not present

## 2024-10-03 DIAGNOSIS — Z720 Tobacco use: Secondary | ICD-10-CM | POA: Diagnosis not present

## 2024-10-03 DIAGNOSIS — F17210 Nicotine dependence, cigarettes, uncomplicated: Secondary | ICD-10-CM | POA: Insufficient documentation

## 2024-10-03 DIAGNOSIS — I10 Essential (primary) hypertension: Secondary | ICD-10-CM | POA: Insufficient documentation

## 2024-10-03 DIAGNOSIS — D72829 Elevated white blood cell count, unspecified: Secondary | ICD-10-CM | POA: Insufficient documentation

## 2024-10-03 DIAGNOSIS — Z7951 Long term (current) use of inhaled steroids: Secondary | ICD-10-CM | POA: Diagnosis not present

## 2024-10-03 LAB — CBC WITH DIFFERENTIAL (CANCER CENTER ONLY)
Abs Immature Granulocytes: 0.11 K/uL — ABNORMAL HIGH (ref 0.00–0.07)
Basophils Absolute: 0.1 K/uL (ref 0.0–0.1)
Basophils Relative: 1 %
Eosinophils Absolute: 0.9 K/uL — ABNORMAL HIGH (ref 0.0–0.5)
Eosinophils Relative: 6 %
HCT: 50.2 % (ref 39.0–52.0)
Hemoglobin: 16.5 g/dL (ref 13.0–17.0)
Immature Granulocytes: 1 %
Lymphocytes Relative: 23 %
Lymphs Abs: 3.3 K/uL (ref 0.7–4.0)
MCH: 28.4 pg (ref 26.0–34.0)
MCHC: 32.9 g/dL (ref 30.0–36.0)
MCV: 86.6 fL (ref 80.0–100.0)
Monocytes Absolute: 1.3 K/uL — ABNORMAL HIGH (ref 0.1–1.0)
Monocytes Relative: 9 %
Neutro Abs: 8.9 K/uL — ABNORMAL HIGH (ref 1.7–7.7)
Neutrophils Relative %: 60 %
Platelet Count: 261 K/uL (ref 150–400)
RBC: 5.8 MIL/uL (ref 4.22–5.81)
RDW: 16.5 % — ABNORMAL HIGH (ref 11.5–15.5)
WBC Count: 14.7 K/uL — ABNORMAL HIGH (ref 4.0–10.5)
nRBC: 0 % (ref 0.0–0.2)

## 2024-10-03 LAB — CMP (CANCER CENTER ONLY)
ALT: 13 U/L (ref 0–44)
AST: 22 U/L (ref 15–41)
Albumin: 3.7 g/dL (ref 3.5–5.0)
Alkaline Phosphatase: 82 U/L (ref 38–126)
Anion gap: 6 (ref 5–15)
BUN: 10 mg/dL (ref 8–23)
CO2: 24 mmol/L (ref 22–32)
Calcium: 8.7 mg/dL — ABNORMAL LOW (ref 8.9–10.3)
Chloride: 105 mmol/L (ref 98–111)
Creatinine: 0.91 mg/dL (ref 0.61–1.24)
GFR, Estimated: >60 mL/min (ref >60)
Glucose, Bld: 79 mg/dL (ref 70–99)
Potassium: 3.3 mmol/L — ABNORMAL LOW (ref 3.5–5.1)
Sodium: 135 mmol/L (ref 135–145)
Total Bilirubin: 0.8 mg/dL (ref 0.0–1.2)
Total Protein: 7.4 g/dL (ref 6.5–8.1)

## 2024-10-03 LAB — LACTATE DEHYDROGENASE: LDH: 171 U/L (ref 98–192)

## 2024-10-03 NOTE — Assessment & Plan Note (Signed)
#  Leukocytosis  Previous work-up showed multiple myeloma panel is negative.  LDH normal.JAK2 V617F mutation negative, with reflex to other mutations CALR, MPL, JAK 2 Ex 12-15 mutations negative.BCR ABL1 negative. Flow cytometry negative. 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  This is nonspecific, could be secondary to reactive etiology versus neoplasm. Clinically Austin Nunez is doing well with no constitutional symptoms.  leukocytosis most likely reactive in nature, secondary to chronic sinusitis, smoking. I recommend observation.

## 2024-10-03 NOTE — Progress Notes (Signed)
 Hematology/Oncology Progress note [phone] [phone]      Patient Care Team: Sadie Manna, MD as PCP - General (Internal Medicine) Babara Call, MD as Consulting Physician (Oncology)  ASSESSMENT & PLAN:   Leukocytosis #Leukocytosis  Previous work-up showed multiple myeloma panel is negative.  LDH normal.JAK2 V617F mutation negative, with reflex to other mutations CALR, MPL, JAK 2 Ex 12-15 mutations negative.BCR ABL1 negative. Flow cytometry negative. 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  This is nonspecific, could be secondary to reactive etiology versus neoplasm. Clinically he is doing well with no constitutional symptoms.  leukocytosis most likely reactive in nature, secondary to chronic sinusitis, smoking. I recommend observation.  Tobacco user Smoke cessation was discussed with patient. Follow up with lung cancer screening program, CT annually.  No orders of the defined types were placed in this encounter.  Follow-up in 1 year All questions were answered. The patient knows to call the clinic with any problems, questions or concerns.  Call Babara, MD, PhD [facility] Hematology Oncology 10/03/2024   CHIEF COMPLAINTS/REASON FOR VISIT:  Leukocytosis  HISTORY OF PRESENTING ILLNESS:  Austin Nunez is a  68 y.o.  male with PMH listed below who was referred to me for evaluation of leukocytosis Reviewed patient' recent labs obtained by PCP.   08/12/2021 CBC showed elevated white count of 14.3, predominantly neutrophilia and basophilia.  Previous lab records reviewed. Leukocytosis onset of chronic, duration is since at least March 2019.   No aggravating or elevated factors. Associated symptoms or signs: denies Denies weight loss, fever, chills,night sweats, fatigue Smoking history: every day smoker, 2 packs per day, 92 pack year smoking history History of recent oral steroid use or steroid injection: denies History of recent infection:  denies Autoimmune disease history: denies  INTERVAL HISTORY Austin Nunez is a 68 y.o. male who has above history reviewed by me today presents for follow up visit for leukocytosis Patient reports feeling well.  He continues to get chronic sinus infection.   He smokes daily.  1 PPD Denies any unintentional weight loss, fever He has noticed  multiple lower extremity nodules for several months.  Review of Systems  Constitutional:  Negative for appetite change, chills, fatigue, fever and unexpected weight change.  HENT:   Negative for hearing loss, trouble swallowing and voice change.        Sinus problem  Eyes:  Negative for eye problems and icterus.  Respiratory:  Negative for chest tightness, cough and shortness of breath.   Cardiovascular:  Negative for chest pain and leg swelling.  Gastrointestinal:  Negative for abdominal distention and abdominal pain.  Endocrine: Negative for hot flashes.  Genitourinary:  Negative for difficulty urinating, dysuria and frequency.   Musculoskeletal:  Negative for arthralgias.  Skin:  Negative for itching and rash.  Neurological:  Negative for light-headedness and numbness.  Hematological:  Negative for adenopathy. Does not bruise/bleed easily.  Psychiatric/Behavioral:  Negative for confusion.      MEDICAL HISTORY:  Past Medical History:  Diagnosis Date   COPD (chronic obstructive pulmonary disease) (HCC)    Essential hypertension    Pulmonary hyperinflation    Tobacco user     SURGICAL HISTORY: Past Surgical History:  Procedure Laterality Date   colonoscopy  03/05/2019   hyperplastic colon polyp/ repeat 10 yrs   repair malunion/ non union femur right 1989     UPPER GI ENDOSCOPY      SOCIAL HISTORY: Social History   Socioeconomic History   Marital status: Single  Spouse name: Not on file   Number of children: Not on file   Years of education: Not on file   Highest education level: Not on file  Occupational History   Not on file   Tobacco Use   Smoking status: Every Day    Current packs/day: 2.00    Average packs/day: 2.0 packs/day for 46.0 years (92.0 ttl pk-yrs)    Types: Cigarettes   Smokeless tobacco: Never  Substance and Sexual Activity   Alcohol use: No   Drug use: Not on file   Sexual activity: Not on file  Other Topics Concern   Not on file  Social History Narrative   Not on file   Social Drivers of Health   Financial Resource Strain: Low Risk  (07/05/2024)   Received from [facility]   Overall Financial Resource Strain (CARDIA)    Difficulty of Paying Living Expenses: Not hard at all  Food Insecurity: No Food Insecurity (07/05/2024)   Received from [facility]   Hunger Vital Sign    Within the past 12 months, you worried that your food would run out before you got the money to buy more.: Never true    Within the past 12 months, the food you bought just didn't last and you didn't have money to get more.: Never true  Transportation Needs: No Transportation Needs (07/05/2024)   Received from [facility] - Transportation    In the past 12 months, has lack of transportation kept you from medical appointments or from getting medications?: No    Lack of Transportation (Non-Medical): No  Physical Activity: Not on file  Stress: Not on file  Social Connections: Not on file  Intimate Partner Violence: Not on file    FAMILY HISTORY: History reviewed. No pertinent family history.  ALLERGIES:  is allergic to aspirin.  MEDICATIONS:  Current Outpatient Medications  Medication Sig Dispense Refill   amLODipine (NORVASC) 10 MG tablet Take 10 mg by mouth daily.     budesonide-formoterol (SYMBICORT) 160-4.5 MCG/ACT inhaler Inhale into the lungs.     meloxicam  (MOBIC ) 15 MG tablet Take 15 mg by mouth daily as needed for pain.     olmesartan (BENICAR) 40 MG tablet Take 40 mg by mouth daily.     omeprazole (PRILOSEC) 20 MG capsule Take 1 capsule  by mouth daily.     No current facility-administered medications for this visit.     PHYSICAL EXAMINATION: ECOG PERFORMANCE STATUS: 0 - Asymptomatic Vitals:   10/03/24 1052 10/03/24 1057  BP: (!) 148/84 (!) 143/81  Pulse: 72   Resp: 18   Temp: (!) 96.8 F (36 C)   SpO2: 94%    Filed Weights   10/03/24 1052  Weight: 231 lb 8 oz (105 kg)    Physical Exam Constitutional:      General: He is not in acute distress. HENT:     Head: Normocephalic and atraumatic.  Eyes:     General: No scleral icterus. Cardiovascular:     Rate and Rhythm: Normal rate and regular rhythm.     Heart sounds: Normal heart sounds.  Pulmonary:     Effort: Pulmonary effort is normal. No respiratory distress.     Breath sounds: No wheezing.     Comments: Severely decreased breath sound bilaterally.  Abdominal:     General: Bowel sounds are normal. There is no distension.     Palpations: Abdomen is soft.  Musculoskeletal:  General: No deformity. Normal range of motion.     Cervical back: Normal range of motion and neck supple.  Skin:    General: Skin is warm and dry.     Findings: No erythema.     Comments: Bilateral lower extremities skin nodules.   Neurological:     Mental Status: He is alert and oriented to person, place, and time. Mental status is at baseline.     Cranial Nerves: No cranial nerve deficit.     Coordination: Coordination normal.  Psychiatric:        Mood and Affect: Mood normal.        Latest Ref Rng & Units 10/03/2024   10:39 AM  CMP  Glucose 70 - 99 mg/dL 79   BUN 8 - 23 mg/dL 10   Creatinine 9.38 - 1.24 mg/dL 9.08   Sodium 864 - 854 mmol/L 135   Potassium 3.5 - 5.1 mmol/L 3.3   Chloride 98 - 111 mmol/L 105   CO2 22 - 32 mmol/L 24   Calcium 8.9 - 10.3 mg/dL 8.7   Total Protein 6.5 - 8.1 g/dL 7.4   Total Bilirubin 0.0 - 1.2 mg/dL 0.8   Alkaline Phos 38 - 126 U/L 82   AST 15 - 41 U/L 22   ALT 0 - 44 U/L 13       Latest Ref Rng & Units 10/03/2024    10:39 AM  CBC  WBC 4.0 - 10.5 K/uL 14.7   Hemoglobin 13.0 - 17.0 g/dL 83.4   Hematocrit 60.9 - 52.0 % 50.2   Platelets 150 - 400 K/uL 261      RADIOGRAPHIC STUDIES: I have personally reviewed the radiological images as listed and agreed with the findings in the report. CT CHEST LUNG CA SCREEN LOW DOSE W/O CM Result Date: 09/22/2024 CLINICAL DATA:  Current 95 pack-year smoker. EXAM: CT CHEST WITHOUT CONTRAST LOW-DOSE FOR LUNG CANCER SCREENING TECHNIQUE: Multidetector CT imaging of the chest was performed following the standard protocol without IV contrast. RADIATION DOSE REDUCTION: This exam was performed according to the departmental dose-optimization program which includes automated exposure control, adjustment of the mA and/or kV according to patient size and/or use of iterative reconstruction technique. COMPARISON:  07/18/2023. FINDINGS: Cardiovascular: Atherosclerotic calcification of the aorta, aortic valve and coronary arteries. Heart size normal. No pericardial effusion. Mediastinum/Nodes: No pathologically enlarged mediastinal or axillary lymph nodes. Hilar regions are difficult to definitively evaluate without IV contrast. Esophagus is grossly unremarkable. Lungs/Pleura: Centrilobular and paraseptal emphysema. Smoking related respiratory bronchiolitis. Pulmonary nodules measure 7.6 mm or less in size, as before. No new pulmonary nodules. No pleural fluid. Minimal debris in the airway. Upper Abdomen: Left adrenal adenoma. No specific follow-up necessary. Visualized portions of the liver, gallbladder, adrenal glands, left kidney, spleen, pancreas, stomach and bowel are otherwise grossly unremarkable. No upper abdominal adenopathy. Musculoskeletal: Degenerative changes in the spine. IMPRESSION: 1. Lung-RADS 2, benign appearance or behavior. Continue annual screening with low-dose chest CT without contrast in 12 months. 2. Left adrenal adenoma. 3. Aortic atherosclerosis (ICD10-I70.0). Coronary  artery calcification. 4.  Emphysema (ICD10-J43.9). Electronically Signed   By: Newell Eke M.D.   On: 09/22/2024 15:46    LABORATORY DATA:  I have reviewed the data as listed     Latest Ref Rng & Units 10/03/2024   10:39 AM 10/03/2023   10:19 AM 10/05/2022   10:12 AM  CBC  WBC 4.0 - 10.5 K/uL 14.7  14.0  13.6   Hemoglobin 13.0 -  17.0 g/dL 83.4  84.2  82.9   Hematocrit 39.0 - 52.0 % 50.2  48.0  51.0   Platelets 150 - 400 K/uL 261  271  246       Latest Ref Rng & Units 10/03/2024   10:39 AM 10/03/2023   10:19 AM 10/05/2022   10:12 AM  CMP  Glucose 70 - 99 mg/dL 79  67  94   BUN 8 - 23 mg/dL 10  10  8    Creatinine 0.61 - 1.24 mg/dL 9.08  9.02  9.06   Sodium 135 - 145 mmol/L 135  135  138   Potassium 3.5 - 5.1 mmol/L 3.3  3.9  4.2   Chloride 98 - 111 mmol/L 105  105  107   CO2 22 - 32 mmol/L 24  24  26    Calcium 8.9 - 10.3 mg/dL 8.7  8.9  9.1   Total Protein 6.5 - 8.1 g/dL 7.4  7.9  7.9   Total Bilirubin 0.0 - 1.2 mg/dL 0.8  0.6  0.7   Alkaline Phos 38 - 126 U/L 82  77  76   AST 15 - 41 U/L 22  17  23    ALT 0 - 44 U/L 13  12  17

## 2024-10-03 NOTE — Assessment & Plan Note (Signed)
Smoke cessation was discussed with patient. Follow up with lung cancer screening program, CT annually.

## 2024-11-09 ENCOUNTER — Telehealth: Payer: Self-pay | Admitting: Oncology

## 2024-11-09 NOTE — Telephone Encounter (Signed)
 VM left with patient to clarify a billing issue. Made patient aware that the outstanding $30 amount due on his account is the patient responsibility portion of the labs drawn here at the cancer center in OCT 2025 and not a request for the MD visit copay which he already paid.   Direct ext left for patient if he has further questions and also encouraged him to call Patient Accounting at [phone] should he like to request an itemized bill.
